# Patient Record
Sex: Female | Born: 1995 | Race: White | Hispanic: No | Marital: Married | Smoking: Never smoker
Health system: Southern US, Community
[De-identification: ages and names within clinical notes are randomized; demographics above are authoritative.]

## PROBLEM LIST (undated history)

## (undated) DIAGNOSIS — O24419 Gestational diabetes mellitus in pregnancy, unspecified control: Secondary | ICD-10-CM

## (undated) DIAGNOSIS — G43909 Migraine, unspecified, not intractable, without status migrainosus: Secondary | ICD-10-CM

## (undated) HISTORY — DX: Gestational diabetes mellitus in pregnancy, unspecified control: O24.419

## (undated) HISTORY — PX: ADENOIDECTOMY: SUR15

## (undated) HISTORY — PX: TYMPANOSTOMY TUBE PLACEMENT: SHX32

## (undated) HISTORY — DX: Migraine, unspecified, not intractable, without status migrainosus: G43.909

---
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
## 2017-11-28 ENCOUNTER — Other Ambulatory Visit: Payer: Self-pay

## 2017-11-28 ENCOUNTER — Telehealth: Payer: Self-pay

## 2017-11-28 DIAGNOSIS — Z30011 Encounter for initial prescription of contraceptive pills: Secondary | ICD-10-CM

## 2017-11-28 MED ORDER — SRONYX 0.1-20 MG-MCG PO TABS: 1.0000 | ORAL_TABLET | Freq: Every day | ORAL | 0 refills | Status: DC

## 2017-11-28 MED ORDER — SRONYX 0.1-20 MG-MCG PO TABS: 1.0000 | ORAL_TABLET | Freq: Every day | ORAL | 11 refills | Status: DC

## 2017-11-28 NOTE — Telephone Encounter (Signed)
Monique Reyes calling for [employer]. [facility] OCP. She is out & has an apt 12/05/17. ZO#[phone]#[phone]

## 2017-11-28 NOTE — Telephone Encounter (Signed)
Refill sent.

## 2017-12-05 ENCOUNTER — Encounter: Payer: Self-pay | Admitting: Obstetrics and Gynecology

## 2017-12-05 ENCOUNTER — Ambulatory Visit (INDEPENDENT_AMBULATORY_CARE_PROVIDER_SITE_OTHER): Payer: 59 | Admitting: Obstetrics and Gynecology

## 2017-12-05 VITALS — BP 120/80 | HR 57 | Ht 68.0 in | Wt 191.0 lb

## 2017-12-05 DIAGNOSIS — Z124 Encounter for screening for malignant neoplasm of cervix: Secondary | ICD-10-CM

## 2017-12-05 DIAGNOSIS — Z113 Encounter for screening for infections with a predominantly sexual mode of transmission: Secondary | ICD-10-CM

## 2017-12-05 DIAGNOSIS — Z01419 Encounter for gynecological examination (general) (routine) without abnormal findings: Secondary | ICD-10-CM | POA: Diagnosis not present

## 2017-12-05 DIAGNOSIS — Z3041 Encounter for surveillance of contraceptive pills: Secondary | ICD-10-CM

## 2017-12-05 MED ORDER — LEVONORGESTREL-ETHINYL ESTRAD 0.1-20 MG-MCG PO TABS: 1.0000 | ORAL_TABLET | Freq: Every day | ORAL | 3 refills | Status: DC

## 2017-12-05 NOTE — Patient Instructions (Signed)
I value your feedback and entrusting us with your care. If you get a [facility] patient survey, I would appreciate you taking the time to let us know about your experience today. Thank you! 

## 2017-12-05 NOTE — Progress Notes (Signed)
PCP:  Patient, No Pcp Per   Chief Complaint  Patient presents with  . Gynecologic Exam     HPI:      Ms. Merwyn KatosKylie N Underwood is a 21 y.o. G0P0000 who LMP was Patient's last menstrual period was 11/28/2017., presents today for her annual examination.  Her menses are regular every 28-30 days, lasting 5 days.  Dysmenorrhea none. She does not have intermenstrual bleeding.  Sex activity: single partner, contraception - OCP (estrogen/progesterone).  Hx of STDs: none  There is no FH of breast cancer. There is a FH of ovarian cancer in her MGGM, genetic testing not indicated for pt. Pt's MGM is "gene neg"; awaiting MyRisk results for pt's mom. The patient does not do self-breast exams.  Tobacco use: The patient denies current or previous tobacco use. Alcohol use: social drinker No drug use.  Exercise: not active  She does get adequate calcium and Vitamin D in her diet.  Gardasil vaccine declined in the past and today.   Past Medical History:  Diagnosis Date  . Migraine     Past Surgical History:  Procedure Laterality Date  . ADENOIDECTOMY    . TYMPANOSTOMY TUBE PLACEMENT      Family History  Problem Relation Age of Onset  . Ovarian cancer Other   . Colon cancer Other     Social History   Socioeconomic History  . Marital status: Unknown    Spouse name: Not on file  . Number of children: Not on file  . Years of education: Not on file  . Highest education level: Not on file  Social Needs  . Financial resource strain: Not on file  . Food insecurity - worry: Not on file  . Food insecurity - inability: Not on file  . Transportation needs - medical: Not on file  . Transportation needs - non-medical: Not on file  Occupational History  . Not on file  Tobacco Use  . Smoking status: Never Smoker  . Smokeless tobacco: Never Used  Substance and Sexual Activity  . Alcohol use: No    Frequency: Never  . Drug use: No  . Sexual activity: Yes    Birth control/protection:  Pill  Other Topics Concern  . Not on file  Social History Narrative  . Not on file    Current Meds  Medication Sig  . levonorgestrel-ethinyl estradiol (SRONYX) 0.1-20 MG-MCG tablet Take 1 tablet by mouth daily.  . [DISCONTINUED] SRONYX 0.1-20 MG-MCG tablet Take 1 tablet by mouth daily.     ROS:  Review of Systems  Constitutional: Negative for fatigue, fever and unexpected weight change.  Respiratory: Negative for cough, shortness of breath and wheezing.   Cardiovascular: Negative for chest pain, palpitations and leg swelling.  Gastrointestinal: Negative for blood in stool, constipation, diarrhea, nausea and vomiting.  Endocrine: Negative for cold intolerance, heat intolerance and polyuria.  Genitourinary: Negative for dyspareunia, dysuria, flank pain, frequency, genital sores, hematuria, menstrual problem, pelvic pain, urgency, vaginal bleeding, vaginal discharge and vaginal pain.  Musculoskeletal: Negative for back pain, joint swelling and myalgias.  Skin: Negative for rash.  Neurological: Negative for dizziness, syncope, light-headedness, numbness and headaches.  Hematological: Negative for adenopathy.  Psychiatric/Behavioral: Negative for agitation, confusion, sleep disturbance and suicidal ideas. The patient is not nervous/anxious.      Objective: BP 120/80   Pulse (!) 57   Ht 5\' 8"  (1.727 m)   Wt 191 lb (86.6 kg)   LMP 11/28/2017   BMI 29.04 kg/m  Physical Exam  Constitutional: She is oriented to person, place, and time. She appears well-developed and well-nourished.  Genitourinary: Vagina normal and uterus normal. There is no rash or tenderness on the right labia. There is no rash or tenderness on the left labia. No erythema or tenderness in the vagina. No vaginal discharge found. Right adnexum does not display mass and does not display tenderness. Left adnexum does not display mass and does not display tenderness. Cervix does not exhibit motion tenderness or polyp.  Uterus is not enlarged or tender.  Neck: Normal range of motion. No thyromegaly present.  Cardiovascular: Normal rate, regular rhythm and normal heart sounds.  No murmur heard. Pulmonary/Chest: Effort normal and breath sounds normal. Right breast exhibits no mass, no nipple discharge, no skin change and no tenderness. Left breast exhibits no mass, no nipple discharge, no skin change and no tenderness.  Abdominal: Soft. There is no tenderness. There is no guarding.  Musculoskeletal: Normal range of motion.  Neurological: She is alert and oriented to person, place, and time. No cranial nerve deficit.  Psychiatric: She has a normal mood and affect. Her behavior is normal.  Vitals reviewed.  Assessment/Plan: Encounter for annual routine gynecological examination  Cervical cancer screening - Plan: IGP,CtNgTv,rfx Aptima HPV ASCU  Screening for STD (sexually transmitted disease) - Plan: IGP,CtNgTv,rfx Aptima HPV ASCU  Encounter for surveillance of contraceptive pills - OCP RF - Plan: levonorgestrel-ethinyl estradiol (SRONYX) 0.1-20 MG-MCG tablet  Meds ordered this encounter  Medications  . levonorgestrel-ethinyl estradiol (SRONYX) 0.1-20 MG-MCG tablet    Sig: Take 1 tablet by mouth daily.    Dispense:  3 Package    Refill:  3             GYN counsel adequate intake of calcium and vitamin D, Gardasil vaccine     F/U  Return in about 1 year (around 12/05/2018).  Lorriann Hansmann B. Breelyn Icard, PA-C 12/05/2017 3:26 PM

## 2017-12-07 LAB — IGP,CTNGTV,RFX APTIMA HPV ASCU
Chlamydia, Nuc. Acid Amp: NEGATIVE
Gonococcus, Nuc. Acid Amp: NEGATIVE
PAP Smear Comment: 0
Trich vag by NAA: NEGATIVE

## 2018-01-21 ENCOUNTER — Ambulatory Visit
Admission: EM | Admit: 2018-01-21 | Discharge: 2018-01-21 | Disposition: A | Discharge status: Home / Self Care | Payer: 59 | Attending: Emergency Medicine | Admitting: Emergency Medicine

## 2018-01-21 ENCOUNTER — Other Ambulatory Visit: Payer: Self-pay

## 2018-01-21 DIAGNOSIS — K529 Noninfective gastroenteritis and colitis, unspecified: Secondary | ICD-10-CM | POA: Diagnosis not present

## 2018-01-21 MED ORDER — ONDANSETRON 8 MG PO TBDP
8.0000 mg | ORAL_TABLET | Freq: Once | ORAL | Status: AC
  Administered 2018-01-21: 8 mg via ORAL

## 2018-01-21 MED ORDER — ONDANSETRON 8 MG PO TBDP: 8.0000 mg | ORAL_TABLET | Freq: Three times a day (TID) | ORAL | 0 refills | Status: DC | PRN

## 2018-01-21 NOTE — ED Provider Notes (Signed)
HPI  SUBJECTIVE:  Monique Reyes is a 22 y.o. female who presents with multiple episodes of nonbilious, nonbloody emesis starting 2 days ago. accompanied by watery, nonbloody diarrhea.  The vomiting stopped yesterday, then she ate a grilled cheese and that she vomited again.  States that she is tolerating saltine crackers today.  She reports no further episodes of diarrhea.  She states that she is thirsty.  She tried ibuprofen 400 mg with improvement in her symptoms 2 days ago.  No antipyretic in the past 6-8 hours.  Symptoms are worse with eating food.  She denies fevers, abdominal pain, abdominal distention, change in urine output.  States her manager had identical symptoms prior to her illness starting.  She denies any raw or undercooked foods, questionable leftovers.  No exposure to petting zoos, chickens, reptiles.  Past medical history negative for kidney disease, diabetes, hypertension.  LMP: Now.  Denies the possibility of being pregnant.  PMD: None.   Past Medical History:  Diagnosis Date  . Migraine     Past Surgical History:  Procedure Laterality Date  . ADENOIDECTOMY    . TYMPANOSTOMY TUBE PLACEMENT      Family History  Problem Relation Age of Onset  . Ovarian cancer Other   . Colon cancer Other     Social History   Tobacco Use  . Smoking status: Never Smoker  . Smokeless tobacco: Never Used  Substance Use Topics  . Alcohol use: Yes    Frequency: Never    Comment: social  . Drug use: No    No current facility-administered medications for this encounter.   Current Outpatient Medications:  .  levonorgestrel-ethinyl estradiol (SRONYX) 0.1-20 MG-MCG tablet, Take 1 tablet by mouth daily., Disp: 3 Package, Rfl: 3 .  ondansetron (ZOFRAN ODT) 8 MG disintegrating tablet, Take 1 tablet (8 mg total) by mouth every 8 (eight) hours as needed for nausea or vomiting., Disp: 20 tablet, Rfl: 0  No Known Allergies   ROS  As noted in HPI.   Physical Exam  BP 126/66 (BP  Location: Left Arm)   Pulse (!) 53   Temp 98 F (36.7 C)   Resp 18   Ht 5\' 7"  (1.702 m)   Wt 190 lb (86.2 kg)   LMP 01/18/2018   SpO2 98%   BMI 29.76 kg/m  Orthostatic VS for the past 24 hrs:  BP- Lying Pulse- Lying BP- Sitting Pulse- Sitting BP- Standing at 0 minutes Pulse- Standing at 0 minutes  01/21/18 1017 119/61 59 116/73 62 122/70 74    Constitutional: Well developed, well nourished, no acute distress Eyes:  EOMI, conjunctiva normal bilaterally HENT: Normocephalic, atraumatic,mucus membranes moist Respiratory: Normal inspiratory effort Cardiovascular: Normal rate and rhythm, no murmurs, rubs, gallops. GI: nondistended soft, nontender, active bowel sounds, no rebound, guarding.  Negative Murphy, negative McBurney  Back no CVA tenderness  skin: Refill less than 2 seconds, no rash, skin intact Musculoskeletal: no deformities Neurologic: Alert & oriented x 3, no focal neuro deficits Psychiatric: Speech and behavior appropriate   ED Course   Medications  ondansetron (ZOFRAN-ODT) disintegrating tablet 8 mg (8 mg Oral Given 01/21/18 1017)    Orders Placed This Encounter  Procedures  . Orthostatic vital signs    Standing Status:   Standing    Number of Occurrences:   1  . Offer Fluids    Standing Status:   Standing    Number of Occurrences:   20    No results found for this  or any previous visit (from the past 24 hour(s)). No results found.  ED Clinical Impression  Gastroenteritis   ED Assessment/Plan  Will check orthostatics, but patient does appear well-hydrated.  She states that she is starting to tolerate p.o. today.  We will give Zofran 8 mg and do p.o. challenge.  Deferring labs.  Doubt intra-abdominal process.  Her abdomen is completely benign.  Presentation is most consistent with a viral gastroenteritis.  Home with Zofran 3 times daily as needed, bland diet, advance diet as tolerated. Will provide a primary care referral list for routine  care.  Patient slightly orthostatic.  She was tolerating p.o. prior to discharge.  Will have her rehydrate orally.  Do not think that she needs IV fluids today.  Discussed  MDM, plan and followup with patient. Discussed sn/sx that should prompt return to the ED. patient agrees with plan.   Meds ordered this encounter  Medications  . ondansetron (ZOFRAN-ODT) disintegrating tablet 8 mg  . ondansetron (ZOFRAN ODT) 8 MG disintegrating tablet    Sig: Take 1 tablet (8 mg total) by mouth every 8 (eight) hours as needed for nausea or vomiting.    Dispense:  20 tablet    Refill:  0    *This clinic note was created using Scientist, clinical (histocompatibility and immunogenetics). Therefore, there may be occasional mistakes despite careful proofreading.   ?   Domenick Gong, MD 01/21/18 [phone]

## 2018-01-21 NOTE — Discharge Instructions (Signed)
Here is a list of primary care providers who are taking new patients: ° °Dr. Deanna Jones, Dr. William Plonk °[address] °[address] °[place] [address] °[phone] ° °[facility] [place] °[address] [address]  °[place] [address]  °[phone] ° °[facility] °[address]  °[place], [place] [address] °[phone] ° °[facility] [place] °[address]  °[phone] °[place], [place] [address] ° °Here are clinics/ other resources who will see you if you do not have insurance. Some have certain criteria that you must meet. Call them and find out what they are: ° °[facility]: °[address] [place] [address] [place], [place] [address] °Phone: [phone] °Hours: First and Third Saturdays of each Month, 9 a.m. - 1 p.m. ° °[facility]: °[address], [place], [place] [address] °Phone: [phone] °Hours: °Tuesday, 4 p.m. - 8 p.m. °Thursday, 1 p.m. - 8 p.m. °Wednesday, 9 a.m. - Noon ° °[facility] °[address], [place], [place] [address] °Phone: [phone] °Pharmacy Phone Number: [phone] °Dental Phone Number: [phone] °ACA Insurance Help: [phone] ° °Dental Hours: °Monday - Thursday, 8 a.m. - 6 p.m. ° °[facility] °[address] [place], [place] [address] °Phone: [phone] °Pharmacy Phone Number: [phone] °ACA Insurance Help: [phone] ° °[facility] °[address] [place], [place] [address] °Phone: [phone] °Pharmacy Phone Number: [phone] °ACA Insurance Help: [phone] ° °[facility] °[address] [place], [place] [address] °Phone: [phone] °ACA Insurance Help: [phone]  ° °[facility] °[address] [place],  [address] °Phone: [phone] ° °Go to www.goodrx.com to look up your medications. This will give you a list of where you can find your prescriptions at the most affordable prices. Or ask the pharmacist what the cash price is,  or if they have any other discount programs available to help make your medication more affordable. This can be less expensive than what you would pay with insurance.   °

## 2018-01-21 NOTE — ED Triage Notes (Signed)
Pt reports her manager at work was sick and couldn't keep anything down. Pt worked with her and then started with n/v/d since Friday.

## 2018-01-24 ENCOUNTER — Telehealth: Payer: Self-pay

## 2018-01-24 NOTE — Telephone Encounter (Signed)
I called pt for visit follow up. Monique Reyes is doing better and will f/u as needed. Monique Reyes reports getting the wrong Dr. Phoebe SharpsNote and is coming back in to get another Dr. Note for her visit. I instructed I will print and leave with front desk staff.

## 2018-11-16 ENCOUNTER — Other Ambulatory Visit: Payer: Self-pay

## 2018-11-16 ENCOUNTER — Other Ambulatory Visit: Payer: Self-pay | Admitting: Obstetrics and Gynecology

## 2018-11-16 DIAGNOSIS — Z30011 Encounter for initial prescription of contraceptive pills: Secondary | ICD-10-CM

## 2018-11-16 DIAGNOSIS — Z3041 Encounter for surveillance of contraceptive pills: Secondary | ICD-10-CM

## 2018-11-16 MED ORDER — LEVONORGESTREL-ETHINYL ESTRAD 0.1-20 MG-MCG PO TABS: 1.0000 | ORAL_TABLET | Freq: Every day | ORAL | 3 refills | Status: DC

## 2018-11-16 NOTE — Telephone Encounter (Signed)
Refilled pt OCP to last to annual

## 2018-12-20 ENCOUNTER — Encounter: Payer: Self-pay | Admitting: Obstetrics and Gynecology

## 2018-12-20 ENCOUNTER — Other Ambulatory Visit (HOSPITAL_COMMUNITY)
Admission: RE | Admit: 2018-12-20 | Discharge: 2018-12-20 | Disposition: A | Discharge status: Home / Self Care | Payer: Managed Care, Other (non HMO) | Source: Ambulatory Visit | Attending: Obstetrics and Gynecology | Admitting: Obstetrics and Gynecology

## 2018-12-20 ENCOUNTER — Ambulatory Visit: Payer: Self-pay | Admitting: Obstetrics and Gynecology

## 2018-12-20 ENCOUNTER — Ambulatory Visit (INDEPENDENT_AMBULATORY_CARE_PROVIDER_SITE_OTHER): Payer: Managed Care, Other (non HMO) | Admitting: Obstetrics and Gynecology

## 2018-12-20 VITALS — BP 118/60 | HR 71 | Ht 66.0 in | Wt 198.0 lb

## 2018-12-20 DIAGNOSIS — Z124 Encounter for screening for malignant neoplasm of cervix: Secondary | ICD-10-CM | POA: Diagnosis present

## 2018-12-20 DIAGNOSIS — Z113 Encounter for screening for infections with a predominantly sexual mode of transmission: Secondary | ICD-10-CM | POA: Diagnosis present

## 2018-12-20 DIAGNOSIS — Z3041 Encounter for surveillance of contraceptive pills: Secondary | ICD-10-CM

## 2018-12-20 DIAGNOSIS — Z01419 Encounter for gynecological examination (general) (routine) without abnormal findings: Secondary | ICD-10-CM

## 2018-12-20 MED ORDER — LEVONORGESTREL-ETHINYL ESTRAD 0.1-20 MG-MCG PO TABS: 1.0000 | ORAL_TABLET | Freq: Every day | ORAL | 3 refills | Status: DC

## 2018-12-20 NOTE — Progress Notes (Signed)
PCP:  Patient, No Pcp Per   Chief Complaint  Patient presents with  . Gynecologic Exam     HPI:      [name] is a 22 y.o. G0P0000 who LMP was Patient's last menstrual period was 11/22/2018 (approximate)., presents today for her annual examination.  Her menses are regular every 28-30 days, lasting 4 days.  Dysmenorrhea none. She does not have intermenstrual bleeding.  Sex activity: single partner, contraception - OCP (estrogen/progesterone).  Last pap: 12/05/17 Results: no abnormalities Hx of STDs: none  There is no FH of breast cancer. There is a FH of ovarian cancer in her MGGM, genetic testing not indicated for pt. Pt's MGM is "gene neg"; mom is MyRisk neg. The patient does not do self-breast exams.  Tobacco use: The patient denies current or previous tobacco use. Alcohol use: social drinker No drug use.  Exercise: not active  She does get adequate calcium and Vitamin D in her diet.  Gardasil vaccine declined in the past and today.   Past Medical History:  Diagnosis Date  . Migraine     Past Surgical History:  Procedure Laterality Date  . ADENOIDECTOMY    . TYMPANOSTOMY TUBE PLACEMENT      Family History  Problem Relation Age of Onset  . Ovarian cancer Other   . Colon cancer Other     Social History   Socioeconomic History  . Marital status: Single    Spouse name: Not on file  . Number of children: Not on file  . Years of education: Not on file  . Highest education level: Not on file  Occupational History  . Not on file  Social Needs  . Financial resource strain: Not on file  . Food insecurity:    Worry: Not on file    Inability: Not on file  . Transportation needs:    Medical: Not on file    Non-medical: Not on file  Tobacco Use  . Smoking status: Never Smoker  . Smokeless tobacco: Never Used  Substance and Sexual Activity  . Alcohol use: Yes    Frequency: Never    Comment: social  . Drug use: No  . Sexual activity: Yes   Birth control/protection: Pill  Lifestyle  . Physical activity:    Days per week: Not on file    Minutes per session: Not on file  . Stress: Not on file  Relationships  . Social connections:    Talks on phone: Not on file    Gets together: Not on file    Attends religious service: Not on file    Active member of club or organization: Not on file    Attends meetings of clubs or organizations: Not on file    Relationship status: Not on file  . Intimate partner violence:    Fear of current or ex partner: Not on file    Emotionally abused: Not on file    Physically abused: Not on file    Forced sexual activity: Not on file  Other Topics Concern  . Not on file  Social History Narrative  . Not on file    Current Meds  Medication Sig  . levonorgestrel-ethinyl estradiol (SRONYX) 0.1-20 MG-MCG tablet Take 1 tablet by mouth daily.  . [DISCONTINUED] levonorgestrel-ethinyl estradiol (SRONYX) 0.1-20 MG-MCG tablet Take 1 tablet by mouth daily.     ROS:  Review of Systems  Constitutional: Negative for fatigue, fever and unexpected weight change.  Respiratory: Negative for cough,  shortness of breath and wheezing.   Cardiovascular: Negative for chest pain, palpitations and leg swelling.  Gastrointestinal: Negative for blood in stool, constipation, diarrhea, nausea and vomiting.  Endocrine: Negative for cold intolerance, heat intolerance and polyuria.  Genitourinary: Negative for dyspareunia, dysuria, flank pain, frequency, genital sores, hematuria, menstrual problem, pelvic pain, urgency, vaginal bleeding, vaginal discharge and vaginal pain.  Musculoskeletal: Negative for back pain, joint swelling and myalgias.  Skin: Negative for rash.  Neurological: Negative for dizziness, syncope, light-headedness, numbness and headaches.  Hematological: Negative for adenopathy.  Psychiatric/Behavioral: Negative for agitation, confusion, sleep disturbance and suicidal ideas. The patient is not  nervous/anxious.      Objective: BP 118/60   Pulse 71   Ht 5\' 6"  (1.676 m)   Wt 198 lb (89.8 kg)   LMP 11/22/2018 (Approximate)   BMI 31.96 kg/m    Physical Exam Constitutional:      Appearance: She is well-developed.  Genitourinary:     Vulva, vagina, uterus, right adnexa and left adnexa normal.     No vulval lesion or tenderness noted.     No vaginal discharge, erythema or tenderness.     No cervical motion tenderness or polyp.     Uterus is not enlarged or tender.     No right or left adnexal mass present.     Right adnexa not tender.     Left adnexa not tender.  Neck:     Musculoskeletal: Normal range of motion.     Thyroid: No thyromegaly.  Cardiovascular:     Rate and Rhythm: Normal rate and regular rhythm.     Heart sounds: Normal heart sounds. No murmur.  Pulmonary:     Effort: Pulmonary effort is normal.     Breath sounds: Normal breath sounds.  Chest:     Breasts:        Right: No mass, nipple discharge, skin change or tenderness.        Left: No mass, nipple discharge, skin change or tenderness.  Abdominal:     Palpations: Abdomen is soft.     Tenderness: There is no abdominal tenderness. There is no guarding.  Musculoskeletal: Normal range of motion.  Neurological:     Mental Status: She is alert and oriented to person, place, and time.     Cranial Nerves: No cranial nerve deficit.  Psychiatric:        Behavior: Behavior normal.  Vitals signs reviewed.    Assessment/Plan: Encounter for annual routine gynecological examination  Cervical cancer screening - Plan: Cytology - PAP  Screening for STD (sexually transmitted disease) - Plan: Cytology - PAP  Encounter for surveillance of contraceptive pills - OCP RF.  - Plan: levonorgestrel-ethinyl estradiol (SRONYX) 0.1-20 MG-MCG tablet  Meds ordered this encounter  Medications  . levonorgestrel-ethinyl estradiol (SRONYX) 0.1-20 MG-MCG tablet    Sig: Take 1 tablet by mouth daily.    Dispense:  3  Package    Refill:  3    Order Specific Question:   Supervising Provider    Answer:   Nadara MustardHARRIS, ROBERT P [[id number]]             GYN counsel adequate intake of calcium and vitamin D, Gardasil vaccine     F/U  Return in about 1 year (around 12/21/2019).  Val Schiavo B. Kimberlynn Lumbra, PA-C 12/20/2018 9:55 AM

## 2018-12-20 NOTE — Patient Instructions (Signed)
I value your feedback and entrusting us with your care. If you get a [facility] patient survey, I would appreciate you taking the time to let us know about your experience today. Thank you! 

## 2018-12-25 LAB — CYTOLOGY - PAP
ADEQUACY: ABSENT
CHLAMYDIA, DNA PROBE: NEGATIVE
DIAGNOSIS: NEGATIVE
Neisseria Gonorrhea: NEGATIVE

## 2019-02-12 ENCOUNTER — Other Ambulatory Visit: Payer: Self-pay

## 2019-02-12 ENCOUNTER — Ambulatory Visit
Admission: EM | Admit: 2019-02-12 | Discharge: 2019-02-12 | Disposition: A | Discharge status: Home / Self Care | Payer: 59 | Attending: Family Medicine | Admitting: Family Medicine

## 2019-02-12 ENCOUNTER — Encounter: Payer: Self-pay | Admitting: Emergency Medicine

## 2019-02-12 DIAGNOSIS — B9789 Other viral agents as the cause of diseases classified elsewhere: Secondary | ICD-10-CM

## 2019-02-12 DIAGNOSIS — J988 Other specified respiratory disorders: Secondary | ICD-10-CM

## 2019-02-12 MED ORDER — BENZONATATE 100 MG PO CAPS: 100.0000 mg | ORAL_CAPSULE | Freq: Three times a day (TID) | ORAL | 0 refills | Status: DC | PRN

## 2019-02-12 MED ORDER — BUTALBITAL-APAP-CAFFEINE 50-325-40 MG PO TABS: 1.0000 | ORAL_TABLET | Freq: Four times a day (QID) | ORAL | 0 refills | Status: DC | PRN

## 2019-02-12 NOTE — ED Provider Notes (Signed)
[facility]    CSN: [id number] Arrival date & time: 02/12/19  1847  History   Chief Complaint Chief Complaint  Patient presents with  . Cough  . Headache   HPI  23 year old female presents with the above complaints.  2-day history of cough, headache, and sweats.  No documented fever.  She reports that she has had dizziness as well.  No medications or interventions tried.  No known exacerbating factors.  No other associated symptoms.  No other complaints.  PMH, Surgical Hx, Family Hx, Social History reviewed and updated as below.  Past Medical History:  Diagnosis Date  . Migraine    Past Surgical History:  Procedure Laterality Date  . ADENOIDECTOMY    . TYMPANOSTOMY TUBE PLACEMENT     OB History    Gravida  0   Para  0   Term  0   Preterm  0   AB  0   Living  0     SAB  0   TAB  0   Ectopic  0   Multiple  0   Live Births  0          Home Medications    Prior to Admission medications   Medication Sig Start Date End Date Taking? Authorizing Provider  levonorgestrel-ethinyl estradiol (SRONYX) 0.1-20 MG-MCG tablet Take 1 tablet by mouth daily. 12/20/18  Yes Copland, Ilona Sorrel, PA-C  benzonatate (TESSALON) 100 MG capsule Take 1 capsule (100 mg total) by mouth 3 (three) times daily as needed for cough. 02/12/19   Tommie Sams, DO  butalbital-acetaminophen-caffeine (FIORICET, ESGIC) [phone] MG tablet Take 1 tablet by mouth every 6 (six) hours as needed for headache or migraine. 02/12/19 02/12/20  Tommie Sams, DO    Family History Family History  Problem Relation Age of Onset  . Ovarian cancer Other   . Colon cancer Other   . Healthy Mother   . Other Father        unknown medical history   Social History Social History   Tobacco Use  . Smoking status: Never Smoker  . Smokeless tobacco: Never Used  Substance Use Topics  . Alcohol use: Yes    Frequency: Never    Comment: social  . Drug use: No   Allergies   Patient has no  known allergies.   Review of Systems Review of Systems  Constitutional: Negative for fever.  Respiratory: Positive for cough.   Neurological: Positive for dizziness and headaches.   Physical Exam Triage Vital Signs ED Triage Vitals  Enc Vitals Group     BP 02/12/19 1926 135/74     Pulse Rate 02/12/19 1926 71     Resp 02/12/19 1926 16     Temp 02/12/19 1926 97.9 F (36.6 C)     Temp Source 02/12/19 1926 Oral     SpO2 02/12/19 1926 100 %     Weight 02/12/19 1925 194 lb (88 kg)     Height 02/12/19 1925 5\' 6"  (1.676 m)     Head Circumference --      Peak Flow --      Pain Score 02/12/19 1925 0     Pain Loc --      Pain Edu? --      Excl. in GC? --    Updated Vital Signs BP 135/74 (BP Location: Left Arm)   Pulse 71   Temp 97.9 F (36.6 C) (Oral)   Resp 16   Ht 5\' 6"  (  1.676 m)   Wt 88 kg   LMP 01/15/2019 (Approximate)   SpO2 100%   BMI 31.31 kg/m   Visual Acuity Right Eye Distance:   Left Eye Distance:   Bilateral Distance:    Right Eye Near:   Left Eye Near:    Bilateral Near:     Physical Exam Vitals signs and nursing note reviewed.  Constitutional:      General: She is not in acute distress.    Appearance: Normal appearance.  HENT:     Head: Normocephalic and atraumatic.     Right Ear: Tympanic membrane normal.     Left Ear: Tympanic membrane normal.     Mouth/Throat:     Pharynx: Oropharynx is clear.  Eyes:     General:        Right eye: No discharge.        Left eye: No discharge.     Conjunctiva/sclera: Conjunctivae normal.  Cardiovascular:     Rate and Rhythm: Normal rate and regular rhythm.  Pulmonary:     Effort: Pulmonary effort is normal.     Breath sounds: Normal breath sounds.  Neurological:     Mental Status: She is alert.  Psychiatric:        Mood and Affect: Mood normal.        Behavior: Behavior normal.    UC Treatments / Results  Labs (all labs ordered are listed, but only abnormal results are displayed) Labs Reviewed - No  data to display  EKG None  Radiology No results found.  Procedures Procedures (including critical care time)  Medications Ordered in UC Medications - No data to display  Initial Impression / Assessment and Plan / UC Course  I have reviewed the triage vital signs and the nursing notes.  Pertinent labs & imaging results that were available during my care of the patient were reviewed by me and considered in my medical decision making (see chart for details).    23 year old female presents with viral respiratory infection.  Exam unrevealing.  No evidence of flu.  Fioricet for headache.  Tessalon Perles for cough.  Supportive care.  Final Clinical Impressions(s) / UC Diagnoses   Final diagnoses:  Viral respiratory infection     Discharge Instructions     Rest.  Medication as prescribed.  Take care  Dr. Adriana Simas     ED Prescriptions    Medication Sig Dispense Auth. Provider   butalbital-acetaminophen-caffeine (FIORICET, ESGIC) [phone] MG tablet Take 1 tablet by mouth every 6 (six) hours as needed for headache or migraine. 20 tablet Janis Sol G, DO   benzonatate (TESSALON) 100 MG capsule Take 1 capsule (100 mg total) by mouth 3 (three) times daily as needed for cough. 30 capsule Tommie Sams, DO     Controlled Substance Prescriptions [place] Controlled Substance Registry consulted? Not Applicable   Tommie Sams, DO 02/12/19 2048

## 2019-02-12 NOTE — ED Triage Notes (Signed)
Patient in today c/o cough, headache, sweats x 2 days. Patient denies fever. Patient has not tried any OTC medications.

## 2019-02-12 NOTE — Discharge Instructions (Signed)
Rest  Medication as prescribed.  Take care  Dr. Ulah Olmo  

## 2019-03-25 ENCOUNTER — Ambulatory Visit
Admission: EM | Admit: 2019-03-25 | Discharge: 2019-03-25 | Disposition: A | Discharge status: Home / Self Care | Payer: 59 | Attending: Urgent Care | Admitting: Urgent Care

## 2019-03-25 ENCOUNTER — Other Ambulatory Visit: Payer: Self-pay

## 2019-03-25 ENCOUNTER — Ambulatory Visit (INDEPENDENT_AMBULATORY_CARE_PROVIDER_SITE_OTHER): Payer: 59

## 2019-03-25 DIAGNOSIS — J069 Acute upper respiratory infection, unspecified: Secondary | ICD-10-CM

## 2019-03-25 DIAGNOSIS — R0981 Nasal congestion: Secondary | ICD-10-CM

## 2019-03-25 DIAGNOSIS — R05 Cough: Secondary | ICD-10-CM

## 2019-03-25 DIAGNOSIS — R0602 Shortness of breath: Secondary | ICD-10-CM

## 2019-03-25 DIAGNOSIS — R059 Cough, unspecified: Secondary | ICD-10-CM

## 2019-03-25 MED ORDER — BENZONATATE 100 MG PO CAPS: 100.0000 mg | ORAL_CAPSULE | Freq: Three times a day (TID) | ORAL | 0 refills | Status: DC | PRN

## 2019-03-25 MED ORDER — AMOXICILLIN 875 MG PO TABS: 875.0000 mg | ORAL_TABLET | Freq: Two times a day (BID) | ORAL | 0 refills | Status: DC

## 2019-03-25 NOTE — ED Provider Notes (Signed)
[facility] [address], [address] [place], [place] [address] [phone]   Time seen: Approximately 4:13 PM  I have reviewed the triage vital signs and the nursing notes.   HISTORY  Chief Complaint Nasal Congestion   HPI Monique Reyes is a 23 y.o. female presents today with a  1 month history of worsening cough and congestion. Cough is intermittently productive (green/yellow sputum). She notes (+) post-tussive vomiting. Patient states, "when I get to coughing really hard, I will throw up the lining of my stomach and any food that may be in there". She denies fevers. Patient has been taking cetirizine and mucinex for symptomatic relief, however notes that neither intervention has improved her symptoms. Patient with exertional shortness of breath. She denies any associated pleuritic chest pain or episodes of hemoptysis. Additionally, patient with complaints of a cough induced headache that has been refractory to the use of APAP.    Past Medical History:  Diagnosis Date  . Migraine     There are no active problems to display for this patient.   Past Surgical History:  Procedure Laterality Date  . ADENOIDECTOMY    . TYMPANOSTOMY TUBE PLACEMENT       No current facility-administered medications for this encounter.   Current Outpatient Medications:  .  levonorgestrel-ethinyl estradiol (SRONYX) 0.1-20 MG-MCG tablet, Take 1 tablet by mouth daily., Disp: 3 Package, Rfl: 3 .  benzonatate (TESSALON) 100 MG capsule, Take 1 capsule (100 mg total) by mouth 3 (three) times daily as needed for cough., Disp: 30 capsule, Rfl: 0 .  butalbital-acetaminophen-caffeine (FIORICET, ESGIC) 50-325-40 MG tablet, Take 1 tablet by mouth every 6 (six) hours as needed for headache or migraine., Disp: 20 tablet, Rfl: 0  Allergies Patient has no known allergies.  Family history Family History  Problem Relation Age of Onset  . Ovarian cancer Other   .  Colon cancer Other   . Healthy Mother   . Other Father        unknown medical history    Social history Social History   Tobacco Use  . Smoking status: Never Smoker  . Smokeless tobacco: Never Used  Substance Use Topics  . Alcohol use: Yes    Frequency: Never    Comment: social  . Drug use: No    Review of Systems Review of Systems  Constitutional: Positive for malaise/fatigue. Negative for diaphoresis, fever and weight loss.  HENT: Negative.   Eyes: Negative.   Respiratory: Positive for cough, sputum production and shortness of breath. Negative for hemoptysis.   Cardiovascular: Negative for chest pain, palpitations, orthopnea, leg swelling and PND.  Gastrointestinal: Positive for nausea and vomiting (post-tussive). Negative for abdominal pain, blood in stool, constipation, diarrhea and melena.  Musculoskeletal: Positive for myalgias. Negative for back pain, falls and joint pain.  Skin: Negative for itching and rash.  Neurological: Positive for headaches (cough induced). Negative for dizziness, tremors and weakness.  Endo/Heme/Allergies: Does not bruise/bleed easily.  Psychiatric/Behavioral: Negative.   All other systems reviewed and are negative.  ____________________________________________   PHYSICAL EXAM:  VITAL SIGNS: ED Triage Vitals  Enc Vitals Group     BP 03/25/19 1549 (!) 120/59     Pulse Rate 03/25/19 1548 (!) 59     Resp 03/25/19 1548 18     Temp 03/25/19 1548 98 F (36.7 C)     Temp Source 03/25/19 1548 Oral     SpO2 03/25/19 1548 99 %  Weight 03/25/19 1550 180 lb (81.6 kg)     Height 03/25/19 1550 5\' 7"  (1.702 m)     Head Circumference --      Peak Flow --      Pain Score 03/25/19 1550 0     Pain Loc --      Pain Edu? --      Excl. in GC? --     Physical Exam  Constitutional: She is oriented to person, place, and time and well-developed, well-nourished, and in no distress.  HENT:  Head: Normocephalic and atraumatic.  Right Ear: Hearing  and tympanic membrane normal.  Left Ear: Hearing and tympanic membrane normal.  Nose: Mucosal edema, rhinorrhea and sinus tenderness present.  Mouth/Throat: Mucous membranes are normal. Posterior oropharyngeal erythema present.  Eyes: Pupils are equal, round, and reactive to light. EOM are normal. No scleral icterus.  Neck: Normal range of motion. Neck supple. No tracheal deviation present. No thyromegaly present.  Cardiovascular: Normal rate, regular rhythm, normal heart sounds and intact distal pulses. Exam reveals no gallop and no friction rub.  No murmur heard. Pulmonary/Chest: Effort normal and breath sounds normal. No respiratory distress. She has no wheezes. She has no rales.  Abdominal: Soft. Bowel sounds are normal. She exhibits no distension. There is no abdominal tenderness.  Musculoskeletal: Normal range of motion.        General: No tenderness or edema.  Lymphadenopathy:    She has no cervical adenopathy.    She has no axillary adenopathy.       Right: No inguinal and no supraclavicular adenopathy present.       Left: No inguinal and no supraclavicular adenopathy present.  Neurological: She is alert and oriented to person, place, and time.  Skin: Skin is warm and dry. No rash noted. No erythema.  Psychiatric: Mood, affect and judgment normal.  Nursing note and vitals reviewed.  ___________________________________________  RADIOLOGY  Dg Chest 2 View  Result Date: 03/25/2019 CLINICAL DATA:  Productive cough and chest pain for 1 month EXAM: CHEST - 2 VIEW COMPARISON:  None. FINDINGS: The heart size and mediastinal contours are within normal limits. Both lungs are clear. The visualized skeletal structures are unremarkable. IMPRESSION: No active cardiopulmonary disease. Electronically Signed   By: Alcide Clever M.D.   On: 03/25/2019 16:33   ____________________________________________   INITIAL IMPRESSION / ASSESSMENT AND PLAN / URGENT CARE COURSE  Pertinent labs & imaging  results that were available during my care of the patient were reviewed by me and considered in my medical decision making (see chart for details).   Monique Reyes is a 23 y.o. female who presents today with 1 month month history of worsening cough and congestion. Cough is productive in nature (green/yellow). She denies fevers. Patient with associated shortness of breath and sinus tenderness. Patient notes that she normally has issues with seasonal allergies, however they have never been this bad. Patient has treated symptomatically with mucinex and cetirizine. She has slight paranasal sinus tenderness on exam. CXR reviewed as negative for acute infection.   Given the chronicity of her symptom constellation, will treat with anti-tussives and cover with course of amoxicillin. Patient encouraged to continue mucinex and ensure that she remains adequately hydrated. Patient also educated on the use of a cool mist vaporizer to help with symptomatic relief.   Discussed follow up with primary care physician this week for re-evaluation. Discussed follow up and return precautions for any new or worsening symptoms. Patient verbalized understanding and consent  with the discharge plan as reviewed. All questions were answered prior to patient discharge.    ____________________________________________  FINAL CLINICAL IMPRESSION(S) / URGENT CARE DIAGNOSES  Final diagnoses:  Cough  SOB (shortness of breath)    ED Discharge Orders         Ordered    benzonatate (TESSALON) 100 MG capsule  3 times daily PRN     03/25/19 1641    amoxicillin (AMOXIL) 875 MG tablet  2 times daily     03/25/19 1641          Note: This dictation was prepared with Dragon dictation along with smaller phrase technology. Any transcriptional errors that result from this process are unintentional.        Verlee Monte, NP 03/25/19 1643

## 2019-03-25 NOTE — Discharge Instructions (Signed)
You are being prescribed antibiotics for an upper respiratory tract infection. Your chest xray was fine.   Continue to take the mucinex. Stay HYDRATED Take the antibiotics and cough medicine as prescribed.

## 2019-03-25 NOTE — ED Triage Notes (Signed)
Pt here for cough, nasal congestion for 1 month and thinks she has a sinus infection. Did cough so hard today she did throw up. Did start taking otc zyrtec and mucinex.

## 2020-02-11 ENCOUNTER — Other Ambulatory Visit: Payer: Self-pay

## 2020-02-11 ENCOUNTER — Ambulatory Visit (INDEPENDENT_AMBULATORY_CARE_PROVIDER_SITE_OTHER): Payer: Managed Care, Other (non HMO)

## 2020-02-11 ENCOUNTER — Encounter: Payer: Self-pay | Admitting: Emergency Medicine

## 2020-02-11 ENCOUNTER — Ambulatory Visit
Admission: EM | Admit: 2020-02-11 | Discharge: 2020-02-11 | Disposition: A | Discharge status: Home / Self Care | Payer: Managed Care, Other (non HMO) | Attending: Family Medicine | Admitting: Family Medicine

## 2020-02-11 DIAGNOSIS — Z20822 Contact with and (suspected) exposure to covid-19: Secondary | ICD-10-CM | POA: Diagnosis present

## 2020-02-11 DIAGNOSIS — J069 Acute upper respiratory infection, unspecified: Secondary | ICD-10-CM

## 2020-02-11 DIAGNOSIS — R05 Cough: Secondary | ICD-10-CM | POA: Diagnosis not present

## 2020-02-11 DIAGNOSIS — Z3202 Encounter for pregnancy test, result negative: Secondary | ICD-10-CM | POA: Insufficient documentation

## 2020-02-11 DIAGNOSIS — Z7189 Other specified counseling: Secondary | ICD-10-CM | POA: Insufficient documentation

## 2020-02-11 LAB — PREGNANCY, URINE: Preg Test, Ur: NEGATIVE

## 2020-02-11 NOTE — ED Provider Notes (Addendum)
[place], [place]   Name: Monique Reyes DOB: 1996-12-19 MRN: [id number] CSN: [id number] PCP: Patient, No Pcp Per  Arrival date and time:  02/11/20 0915  Chief Complaint:  Cough and Emesis   NOTE: Prior to seeing the patient today, I have reviewed the triage nursing documentation and vital signs. Clinical staff has updated patient's PMH/PSHx, current medication list, and drug allergies/intolerances to ensure comprehensive history available to assist in medical decision making.   History:   HPI: Monique Reyes is a 24 y.o. female who presents today with complaints of fatigue, cough, congestion, sore throat (due to cough), and generalized cough that started approximately 2 days ago. Patient denies fevers. Cough is productive of green and brown sputum with no associated shortness of breath or wheezing. She has experienced any nausea, vomiting, diarrhea. No abdominal pain. Vomiting has been mainly post-tussive.  She is eating and drinking well. Patient denies any perceived alterations to her sense of taste or smell. Patient denies being in close contact with anyone known to have SARS-CoV-2 (novel coronavirus). He husband had pneumonia x 2 weeks ago. She has never been tested for SARS-CoV-2 in the past per her report. Patient has not been vaccinated for influenza this season. In efforts to conservatively manage her symptoms at home, the patient notes that she has used Delsym and cold/flu medication, which has not helped to improve her symptoms.  Additionally, patient is questioning possible pregnancy. Patient's last menstrual period was 01/11/2020 (approximate).  Past Medical History:  Diagnosis Date  . Migraine     Past Surgical History:  Procedure Laterality Date  . ADENOIDECTOMY    . TYMPANOSTOMY TUBE PLACEMENT      Family History  Problem Relation Age of Onset  . Ovarian cancer Other   . Colon cancer Other   . Healthy Mother   . Other Father        unknown medical history     Social History   Tobacco Use  . Smoking status: Never Smoker  . Smokeless tobacco: Never Used  Substance Use Topics  . Alcohol use: Yes    Comment: social  . Drug use: No    There are no problems to display for this patient.   Home Medications:    No outpatient medications have been marked as taking for the 02/11/20 encounter [facility] Encounter).    Allergies:   Patient has no known allergies.  Review of Systems (ROS): Review of Systems  Constitutional: Positive for fatigue and fever.  HENT: Positive for sore throat (2/2 cough). Negative for congestion, ear pain, postnasal drip, rhinorrhea, sinus pressure, sinus pain and sneezing.   Eyes: Negative for pain, discharge and redness.  Respiratory: Positive for cough. Negative for chest tightness and shortness of breath.   Cardiovascular: Negative for chest pain and palpitations.  Gastrointestinal: Positive for diarrhea, nausea and vomiting. Negative for abdominal pain.  Musculoskeletal: Negative for arthralgias, back pain, myalgias and neck pain.  Skin: Negative for color change, pallor and rash.  Neurological: Positive for weakness (generalized). Negative for dizziness, syncope and headaches.  Hematological: Negative for adenopathy.     Vital Signs: Today's Vitals   02/11/20 0935 02/11/20 0936 02/11/20 1040  BP: 124/67    Pulse: 82    Resp: 18    Temp: 98.3 F (36.8 C)    TempSrc: Oral    SpO2: 99%    Weight:  220 lb (99.8 kg)   Height:  5\' 7"  (1.702 m)   PainSc:  0-No pain  0-No pain    Physical Exam: Physical Exam  Constitutional: She is oriented to person, place, and time and well-developed, well-nourished, and in no distress.  HENT:  Head: Normocephalic and atraumatic.  Right Ear: Tympanic membrane normal.  Left Ear: Tympanic membrane normal.  Nose: Rhinorrhea present. No mucosal edema or sinus tenderness.  Mouth/Throat: Uvula is midline and mucous membranes are normal. Posterior oropharyngeal erythema  (mild; (+) clear PND) present.  Eyes: Pupils are equal, round, and reactive to light.  Cardiovascular: Normal rate, regular rhythm, normal heart sounds and intact distal pulses.  Pulmonary/Chest: Effort normal. She has decreased breath sounds in the right lower field.  Moderate cough noted in clinic. No SOB or increased WOB. No distress. Able to speak in complete sentences without difficulties. SPO2 99% on RA.  Musculoskeletal:     Cervical back: Normal range of motion and neck supple.  Neurological: She is alert and oriented to person, place, and time. Gait normal.  Skin: Skin is warm and dry. No rash noted. She is not diaphoretic.  Psychiatric: Mood, memory, affect and judgment normal.  Nursing note and vitals reviewed.   Urgent Care Treatments / Results:   Orders Placed This Encounter  Procedures  . Novel Coronavirus, NAA (Hosp order, Send-out to Ref Lab; TAT 18-24 hrs  . DG Chest 2 View  . Pregnancy, urine    LABS: PLEASE NOTE: all labs that were ordered this encounter are listed, however only abnormal results are displayed. Labs Reviewed  NOVEL CORONAVIRUS, NAA (HOSP ORDER, SEND-OUT TO REF LAB; TAT 18-24 HRS)  PREGNANCY, URINE    EKG: -None  RADIOLOGY: DG Chest 2 View  Result Date: 02/11/2020 CLINICAL DATA:  Cough and shortness of breath EXAM: CHEST - 2 VIEW COMPARISON:  March 25, 2019. FINDINGS: Lungs are clear. Heart size and pulmonary vascularity are normal. No adenopathy. No bone lesions. IMPRESSION: No abnormality noted. Electronically Signed   By: Bretta Bang III M.D.   On: 02/11/2020 10:31    PROCEDURES: Procedures  MEDICATIONS RECEIVED THIS VISIT: Medications - No data to display  PERTINENT CLINICAL COURSE NOTES/UPDATES:   Initial Impression / Assessment and Plan / Urgent Care Course:  Pertinent labs & imaging results that were available during my care of the patient were personally reviewed by me and considered in my medical decision making (see  lab/imaging section of note for values and interpretations).  Monique Reyes is a 24 y.o. female who presents to [facility] today with complaints of Cough and Emesis  Patient overall well appearing and in no acute distress today in clinic. Presenting symptoms (see HPI) and exam as documented above. She presents with symptoms associated with SARS-CoV-2 (novel coronavirus). Discussed typical symptom constellation. Reviewed potential for infection and need for testing. Patient amenable to being tested. SARS-CoV-2 swab collected by certified clinical staff. Discussed variable turn around times associated with testing, as swabs are being processed at [facility], and have been taking between 24-48 hours to come back. She was advised to self quarantine, per [facility] DHHS guidelines, until negative results received. These measures are being implemented out of an abundance of caution to prevent transmission and spread during the current SARS-CoV-2 pandemic.   Urine HCG negative for pregnancy.   Radiographs of the chest revealed no acute cardiopulmonary process; no evidence of peribronchial thickening, areas of consolidation, or focal infiltrates.  Presenting symptoms consistent with acute viral illness with cough. Until ruled out with confirmatory lab testing, SARS-CoV-2 remains part of the differential. Her testing  is pending at this time. I discussed with her that her symptoms are felt to be viral in nature, thus antibiotics would not offer her any relief or improve her symptoms any faster than conservative symptomatic management.  Intervention for cough offered, however patient declined citing that her symptoms are mild/controlled. Patient declined offered antiemetic medication as well today. Discussed supportive care measures at home during acute phase of illness. Patient to rest as much as possible. She was encouraged to ensure adequate hydration (water and ORS) to prevent dehydration and electrolyte  derangements.  Recommended warm salt water gargles, hard candies/lozenges, and hot tea with honey/lemon to help soothe the throat and reduce irritation. May use Tylenol and/or Ibuprofen as needed for pain/fever.    Current clinical condition warrants patient being out of work in order to quarantine while waiting for testing results. She was provided with the appropriate documentation to provide to her place of employment that will allow for her to RTW on 02/13/2020 with no restrictions. RTW is contingent on her SARS-CoV-2 test results being reviewed as negative.     Discussed follow up with primary care physician in 1 week for re-evaluation. I have reviewed the follow up and strict return precautions for any new or worsening symptoms. Patient is aware of symptoms that would be deemed urgent/emergent, and would thus require further evaluation either here or in the emergency department. At the time of discharge, she verbalized understanding and consent with the discharge plan as it was reviewed with her. All questions were fielded by provider and/or clinic staff prior to patient discharge.    Final Clinical Impressions / Urgent Care Diagnoses:   Final diagnoses:  Viral URI with cough  Encounter for laboratory testing for COVID-19 virus  Encounter for pregnancy test with result negative  Advice given about COVID-19 virus infection    New Prescriptions:  [place] Controlled Substance Registry consulted? Not Applicable  No orders of the defined types were placed in this encounter.   Recommended Follow up Care:  Patient encouraged to follow up with the following provider within the specified time frame, or sooner as dictated by the severity of her symptoms. As always, she was instructed that for any urgent/emergent care needs, she should seek care either here or in the emergency department for more immediate evaluation.  Follow-up Information    PCP In 1 week.   Why: General reassessment of symptoms if  not improving        NOTE: This note was prepared using Scientist, clinical (histocompatibility and immunogenetics) along with smaller Lobbyist. Despite my best ability to proofread, there is the potential that transcriptional errors may still occur from this process, and are completely unintentional.    Verlee Monte, NP 02/11/20 1125

## 2020-02-11 NOTE — Discharge Instructions (Addendum)
It was very nice seeing you today in clinic. Thank you for entrusting me with your care.  ° °Rest and stay HYDRATED. Water and electrolyte containing beverages (Gatorade, Pedialyte) are best to prevent dehydration and electrolyte abnormalities.  °Recommend warm salt water gargles, hard candies/lozenges, and hot tea with honey/lemon to help soothe the throat and reduce irritation.  °May use Tylenol and/or Ibuprofen as needed for pain/fever.   ° °You were tested for SARS-CoV-2 (novel coronavirus) today. Testing is performed by an outside lab ([facility]) and has variable turn around times ranging between 2-5 days. Current recommendations from the the CDC and [place] DHHS require that you remain out of work in order to quarantine at home until negative test results are have been received. In the event that your test results are positive, you will be contacted with further directives. These measures are being implemented out of an abundance of caution to prevent transmission and spread during the current SARS-CoV-2 pandemic. ° °Make arrangements to follow up with your regular doctor in 1 week for re-evaluation if not improving. If your symptoms/condition worsens, please seek follow up care either here or in the ER. Please remember, our [facility] providers are "right here with you" when you need us.  ° °Again, it was my pleasure to take care of you today. Thank you for choosing our clinic. I hope that you start to feel better quickly.  ° °Nieves Barberi, MSN, APRN, FNP-C, CEN °Advanced Practice Provider °[facility] °

## 2020-02-11 NOTE — ED Triage Notes (Signed)
Pt c/o cough, diarrhea, vomiting, nasal congestion, runny nose. Started about 2 days ago.

## 2020-02-12 ENCOUNTER — Telehealth: Payer: Self-pay | Admitting: Urgent Care

## 2020-02-12 LAB — NOVEL CORONAVIRUS, NAA (HOSP ORDER, SEND-OUT TO REF LAB; TAT 18-24 HRS): SARS-CoV-2, NAA: NOT DETECTED

## 2020-02-12 MED ORDER — AMOXICILLIN-POT CLAVULANATE 875-125 MG PO TABS: 1.0000 | ORAL_TABLET | Freq: Two times a day (BID) | ORAL | 0 refills | Status: AC

## 2020-02-12 MED ORDER — HYDROCOD POLST-CPM POLST ER 10-8 MG/5ML PO SUER: 5.0000 mL | Freq: Two times a day (BID) | ORAL | 0 refills | Status: DC | PRN

## 2020-02-12 NOTE — Telephone Encounter (Signed)
Patient called requesting COVID results. Advised patient her COVID results were negative. She states she is still coughing and at times will vomit brown/green mucous. Patient requesting an antibiotic and cough medication. Message sent to Quentin Mulling, NP.

## 2020-02-12 NOTE — Telephone Encounter (Signed)
    Date: 02/12/2020  Name: Monique Reyes DOB: 1996-12-01 MRN: [id number]  Re: Clinical update and medication request  Patient seen in clinic on 02/11/2020 with URI symptoms, which she was conservatively managing at home. SARS-CoV-2 (novel coronavirus) was suspected. Since her visit, her molecular PCR SARS-CoV-2 testing has resulted as negative. Patient returning call today with reports of acute symptom exacerbation. Cough has progressed and she continues to experience episodes of post-tussive emesis. Cough is productive of green sputum. She was offered intervention for cough yesterday, however she declined. In light of her worsening symptoms, will proceed with treatment.   Allergies: NKDA noted in the patient's EMR.   [place] Controlled Substance Registry consulted? Yes - no concerns identified.  Meds ordered this encounter  Medications  . amoxicillin-clavulanate (AUGMENTIN) 875-125 MG tablet    Sig: Take 1 tablet by mouth 2 (two) times daily for 7 days.    Dispense:  14 tablet    Refill:  0  . chlorpheniramine-HYDROcodone (TUSSIONEX PENNKINETIC ER) 10-8 MG/5ML SUER    Sig: Take 5 mLs by mouth every 12 (twelve) hours as needed for cough. Will causes drowsiness; NO DRIVING.    Dispense:  70 mL    Refill:  0   Disposition: Medications sent to patient's pharmacy on record. Will have clinic nursing staff reach out to patient to make her aware of new orders, including education on use and side effects associated with the cough medication (somnolence, take as directed, no driving). Patient should rest and stay hydrated, with water and electrolyte containing beverages (Gatorade, Pedialyte) being best, to prevent dehydration and electrolyte abnormalities. Patient to follow up with PCP in 1 week if not improving.   Quentin Mulling, MSN, APRN, FNP-C, CEN Advanced Practice Provider [facility] 02/12/2020 4:30 PM

## 2020-04-30 ENCOUNTER — Other Ambulatory Visit: Payer: Self-pay

## 2020-04-30 ENCOUNTER — Ambulatory Visit
Admission: EM | Admit: 2020-04-30 | Discharge: 2020-04-30 | Disposition: A | Discharge status: Home / Self Care | Payer: Managed Care, Other (non HMO) | Attending: Family Medicine | Admitting: Family Medicine

## 2020-04-30 ENCOUNTER — Encounter: Payer: Self-pay | Admitting: Emergency Medicine

## 2020-04-30 DIAGNOSIS — Z20822 Contact with and (suspected) exposure to covid-19: Secondary | ICD-10-CM | POA: Insufficient documentation

## 2020-04-30 DIAGNOSIS — B349 Viral infection, unspecified: Secondary | ICD-10-CM | POA: Insufficient documentation

## 2020-04-30 MED ORDER — ONDANSETRON 4 MG PO TBDP: 4.0000 mg | ORAL_TABLET | Freq: Three times a day (TID) | ORAL | 0 refills | Status: DC | PRN

## 2020-04-30 NOTE — ED Triage Notes (Signed)
Pt c/o sweats, muscle pain, fever (100.8), chills, cough, headache, runny nose and nasal congestion. Started last night.

## 2020-04-30 NOTE — ED Provider Notes (Signed)
[facility] ____________________________________________  Time seen: Approximately 7:14 PM  I have reviewed the triage vital signs and the nursing notes.   HISTORY  Chief Complaint No chief complaint on file.   HPI Monique Reyes is a 24 y.o. female presenting for evaluation of runny nose, nasal congestion, cough, chills, body aches and fever since last night.  Some nausea.  Denies vomiting or diarrhea.  Denies changes in taste or smell.  States T-max 100.8 last night.  Has not taken Tylenol or ibuprofen.  Has continued to overall eat and drink well but decreased appetite.  Denies known sick contacts.  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  Denies chest pain, shortness of breath, rash.  Denies recent sickness.  Reports otherwise doing well.    Past Medical History:  Diagnosis Date  . Migraine     There are no problems to display for this patient.   Past Surgical History:  Procedure Laterality Date  . ADENOIDECTOMY    . TYMPANOSTOMY TUBE PLACEMENT       No current facility-administered medications for this encounter.  Current Outpatient Medications:  .  chlorpheniramine-HYDROcodone (TUSSIONEX PENNKINETIC ER) 10-8 MG/5ML SUER, Take 5 mLs by mouth every 12 (twelve) hours as needed for cough. Will causes drowsiness; NO DRIVING., Disp: 70 mL, Rfl: 0 .  ondansetron (ZOFRAN ODT) 4 MG disintegrating tablet, Take 1 tablet (4 mg total) by mouth every 8 (eight) hours as needed for nausea or vomiting., Disp: 15 tablet, Rfl: 0  Allergies Patient has no known allergies.  Family History  Problem Relation Age of Onset  . Ovarian cancer Other   . Colon cancer Other   . Healthy Mother   . Other Father        unknown medical history    Social History Social History   Tobacco Use  . Smoking status: Never Smoker  . Smokeless tobacco: Never Used  Substance Use Topics  . Alcohol use: Yes    Comment: social  . Drug use: No    Review of Systems  Constitutional: Positive fever ENT: No sore throat.  Positive congestion. Cardiovascular: Denies chest pain. Respiratory: Denies shortness of breath. Gastrointestinal: No abdominal pain.  Positive nausea, no vomiting.  No diarrhea.  Genitourinary: Negative for dysuria. Musculoskeletal: Negative for back pain. Skin: Negative for rash.   ____________________________________________   PHYSICAL EXAM:  VITAL SIGNS: ED Triage Vitals  Enc Vitals Group     BP 04/30/20 1833 111/76     Pulse Rate 04/30/20 1833 87     Resp 04/30/20 1833 18     Temp 04/30/20 1833 98.9 F (37.2 C)     Temp Source 04/30/20 1833 Oral     SpO2 04/30/20 1833 99 %     Weight 04/30/20 1830 220 lb 0.3 oz (99.8 kg)     Height 04/30/20 1830 5\' 7"  (1.702 m)     Head Circumference --      Peak Flow --      Pain Score 04/30/20 1829 7     Pain Loc --      Pain Edu? --      Excl. in GC? --     Constitutional: Alert and oriented. Well appearing and in no acute distress. Eyes: Conjunctivae are normal.  Head: Atraumatic. No sinus tenderness to palpation. No swelling. No erythema.  Ears: no erythema, normal TMs bilaterally.   Nose:Nasal congestion   Mouth/Throat: Mucous membranes are moist. No pharyngeal erythema. No tonsillar swelling or  exudate.  Neck: No stridor.  No cervical spine tenderness to palpation. Hematological/Lymphatic/Immunilogical: No cervical lymphadenopathy. Cardiovascular: Normal rate, regular rhythm. Grossly normal heart sounds.  Good peripheral circulation. Respiratory: Normal respiratory effort.  No retractions. No wheezes, rales or rhonchi. Good air movement.  Musculoskeletal: Ambulatory with steady gait.  Neurologic:  Normal speech and language. No gait instability. Skin:  Skin appears warm, dry and intact. No rash noted. Psychiatric: Mood and affect are normal. Speech and behavior are normal.  ___________________________________________   LABS (all labs ordered are listed, but only  abnormal results are displayed)  Labs Reviewed  SARS CORONAVIRUS 2 (TAT 6-24 HRS)     PROCEDURES Procedures   INITIAL IMPRESSION / ASSESSMENT AND PLAN / ED COURSE  Pertinent labs & imaging results that were available during my care of the patient were reviewed by me and considered in my medical decision making (see chart for details).  Well-appearing patient.  No acute distress.  Suspect viral illness, possible COVID-19.  COVID-19 testing completed.  As needed Zofran Rx given.  Encourage rest, fluids, supportive care.  Work note given. Discussed indication, risks and benefits of medications with patient.  Discussed follow up and return parameters including no resolution or any worsening concerns. Patient verbalized understanding and agreed to plan.   ____________________________________________   FINAL CLINICAL IMPRESSION(S) / ED DIAGNOSES  Final diagnoses:  Viral illness  Encounter for laboratory testing for COVID-19 virus     ED Discharge Orders         Ordered    ondansetron (ZOFRAN ODT) 4 MG disintegrating tablet  Every 8 hours PRN     04/30/20 1850           Note: This dictation was prepared with Dragon dictation along with smaller phrase technology. Any transcriptional errors that result from this process are unintentional.         Marylene Land, NP 04/30/20 1918

## 2020-04-30 NOTE — Discharge Instructions (Signed)
Take medication as prescribed, as needed. Rest. Drink plenty of fluids. Tylenol and ibuprofen as needed.   Follow up with your primary care physician this week as needed. Return to Urgent care for new or worsening concerns.

## 2020-05-01 LAB — SARS CORONAVIRUS 2 (TAT 6-24 HRS): SARS Coronavirus 2: NEGATIVE

## 2020-06-27 ENCOUNTER — Other Ambulatory Visit: Payer: Self-pay

## 2020-06-27 ENCOUNTER — Ambulatory Visit
Admission: EM | Admit: 2020-06-27 | Discharge: 2020-06-27 | Disposition: A | Discharge status: Home / Self Care | Payer: Managed Care, Other (non HMO) | Attending: Emergency Medicine | Admitting: Emergency Medicine

## 2020-06-27 DIAGNOSIS — Z20822 Contact with and (suspected) exposure to covid-19: Secondary | ICD-10-CM | POA: Insufficient documentation

## 2020-06-27 DIAGNOSIS — E860 Dehydration: Secondary | ICD-10-CM | POA: Diagnosis not present

## 2020-06-27 DIAGNOSIS — K2970 Gastritis, unspecified, without bleeding: Secondary | ICD-10-CM

## 2020-06-27 LAB — URINALYSIS, COMPLETE (UACMP) WITH MICROSCOPIC
Glucose, UA: NEGATIVE mg/dL
Ketones, ur: 40 mg/dL — AB
Leukocytes,Ua: NEGATIVE
Nitrite: NEGATIVE
Protein, ur: 100 mg/dL — AB
Specific Gravity, Urine: >1.03 — ABNORMAL HIGH (ref 1.005–1.030)
pH: 5.5 (ref 5.0–8.0)

## 2020-06-27 LAB — BASIC METABOLIC PANEL
Anion gap: 13 (ref 5–15)
BUN: 18 mg/dL (ref 6–20)
CO2: 25 mmol/L (ref 22–32)
Calcium: 9.1 mg/dL (ref 8.9–10.3)
Chloride: 99 mmol/L (ref 98–111)
Creatinine, Ser: 1.05 mg/dL — ABNORMAL HIGH (ref 0.44–1.00)
GFR calc Af Amer: >60 mL/min (ref >60)
GFR calc non Af Amer: >60 mL/min (ref >60)
Glucose, Bld: 110 mg/dL — ABNORMAL HIGH (ref 70–99)
Potassium: 3.8 mmol/L (ref 3.5–5.1)
Sodium: 137 mmol/L (ref 135–145)

## 2020-06-27 LAB — HCG, QUANTITATIVE, PREGNANCY: hCG, Beta Chain, Quant, S: <1 m[IU]/mL (ref <5)

## 2020-06-27 LAB — SARS CORONAVIRUS 2 AG (30 MIN TAT): SARS Coronavirus 2 Ag: NEGATIVE

## 2020-06-27 MED ORDER — ONDANSETRON 4 MG PO TBDP: 4.0000 mg | ORAL_TABLET | Freq: Three times a day (TID) | ORAL | 0 refills | Status: DC | PRN

## 2020-06-27 NOTE — ED Provider Notes (Signed)
[facility] [place]   Name: Monique Reyes DOB: May 29, 1996 MRN: [id number] CSN: [id number] PCP: Patient, No Pcp Per  Arrival date and time:  06/27/20 0920  Chief Complaint:  Emesis and Diarrhea   NOTE: Prior to seeing the patient today, I have reviewed the triage nursing documentation and vital signs. Clinical staff has updated patient's PMH/PSHx, current medication list, and drug allergies/intolerances to ensure comprehensive history available to assist in medical decision making.   History:   HPI: Monique Reyes is a 24 y.o. female who presents today with complaints of nausea vomiting diarrhea for 2 days.  Patient states she has not been able to keep anything down for the past 2 days.  She denies any out of the ordinary foods or drinks.  She states she was in [facility] last weekend, but returned and had no symptoms until about Wednesday night.  She notes that she also has some abdominal pain and generalized body aches.  T-max at home peaked at 100.2.  She has not taken anything for her nausea, vomiting, or slightly elevated temperature.   Patient denies any known exposure to COVID-19.  She states she does not wear her mask in public, only when she works.  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  She states she has unprotected sex with one partner and she does not have any form of birth control.    Past Medical History:  Diagnosis Date  . Migraine     Past Surgical History:  Procedure Laterality Date  . ADENOIDECTOMY    . TYMPANOSTOMY TUBE PLACEMENT      Family History  Problem Relation Age of Onset  . Ovarian cancer Other   . Colon cancer Other   . Healthy Mother   . Other Father        unknown medical history    Social History   Tobacco Use  . Smoking status: Never Smoker  . Smokeless tobacco: Never Used  Vaping Use  . Vaping Use: Never used  Substance Use Topics  . Alcohol use: Yes    Comment: social  . Drug  use: No    There are no problems to display for this patient.   Home Medications:    No outpatient medications have been marked as taking for the 06/27/20 encounter [facility] Encounter).    Allergies:   Patient has no known allergies.  Review of Systems (ROS): Review of Systems  Constitutional: Positive for appetite change, chills, fatigue and fever.  HENT: Negative for facial swelling, sinus pressure, trouble swallowing and voice change.   Respiratory: Negative for cough, shortness of breath, wheezing and stridor.   Gastrointestinal: Positive for abdominal pain, diarrhea, nausea and vomiting. Negative for abdominal distention.  Genitourinary: Negative for decreased urine volume, difficulty urinating, flank pain, frequency and urgency.  Musculoskeletal: Positive for arthralgias. Negative for gait problem and joint swelling.  Neurological: Positive for weakness. Negative for dizziness, light-headedness and headaches.     Vital Signs: Today's Vitals   06/27/20 0937 06/27/20 0938 06/27/20 1125  BP: 122/88    Pulse: (!) 107    Resp: 20    Temp: 99.2 F (37.3 C)    TempSrc: Oral    Weight:  210 lb (95.3 kg)   Height:  5\' 7"  (1.702 m)   PainSc:  7  5     Physical Exam: Physical Exam Vitals and nursing note reviewed.  Constitutional:      General:  She is not in acute distress.    Appearance: Normal appearance. She is well-developed.  HENT:     Head: Normocephalic and atraumatic.     Right Ear: A middle ear effusion is present.     Left Ear: A middle ear effusion is present.     Ears:     Comments: Clear middle ear effusion Eyes:     Conjunctiva/sclera: Conjunctivae normal.  Cardiovascular:     Rate and Rhythm: Normal rate and regular rhythm.     Pulses: Normal pulses.     Heart sounds: Normal heart sounds. No murmur heard.   Pulmonary:     Effort: Pulmonary effort is normal. No respiratory distress.     Breath sounds: Normal breath sounds.  Abdominal:      Palpations: Abdomen is soft.     Tenderness: There is abdominal tenderness in the left upper quadrant and left lower quadrant.  Musculoskeletal:     Cervical back: Neck supple.  Skin:    General: Skin is warm and dry.  Neurological:     Mental Status: She is alert.  Psychiatric:        Mood and Affect: Mood normal.        Behavior: Behavior normal.        Thought Content: Thought content normal.      Urgent Care Treatments / Results:   LABS: PLEASE NOTE: all labs that were ordered this encounter are listed, however only abnormal results are displayed. Labs Reviewed  URINALYSIS, COMPLETE (UACMP) WITH MICROSCOPIC - Abnormal; Notable for the following components:      Result Value   Specific Gravity, Urine >1.030 (*)    Hgb urine dipstick MODERATE (*)    Bilirubin Urine MODERATE (*)    Ketones, ur 40 (*)    Protein, ur 100 (*)    Bacteria, UA FEW (*)    All other components within normal limits  BASIC METABOLIC PANEL - Abnormal; Notable for the following components:   Glucose, Bld 110 (*)    Creatinine, Ser 1.05 (*)    All other components within normal limits  SARS CORONAVIRUS 2 AG (30 MIN TAT)  HCG, QUANTITATIVE, PREGNANCY    EKG: -None  RADIOLOGY: No results found.  PROCEDURES: Procedures  MEDICATIONS RECEIVED THIS VISIT: Medications - No data to display  PERTINENT CLINICAL COURSE NOTES/UPDATES:   Initial Impression / Assessment and Plan / Urgent Care Course:  Pertinent labs & imaging results that were available during my care of the patient were personally reviewed by me and considered in my medical decision making (see lab/imaging section of note for values and interpretations).  Monique Reyes is a 24 y.o. female who presents to [facility] today with complaints of nausea vomiting and diarrhea, diagnosed with dehydration, and treated as such with the medications below. NP and patient reviewed discharge instructions below during visit.   Patient is  well appearing overall in clinic today. She does not appear to be in any acute distress. Presenting symptoms (see HPI) and exam as documented above.   I have reviewed the follow up and strict return precautions for any new or worsening symptoms. Patient is aware of symptoms that would be deemed urgent/emergent, and would thus require further evaluation either here or in the emergency department. At the time of discharge, she verbalized understanding and consent with the discharge plan as it was reviewed with her. All questions were fielded by provider and/or clinic staff prior to patient discharge.  Final Clinical Impressions / Urgent Care Diagnoses:   Final diagnoses:  Dehydration  Gastritis without bleeding, unspecified chronicity, unspecified gastritis type    New Prescriptions:  [place] Controlled Substance Registry consulted? Not Applicable  Meds ordered this encounter  Medications  . ondansetron (ZOFRAN ODT) 4 MG disintegrating tablet    Sig: Take 1 tablet (4 mg total) by mouth every 8 (eight) hours as needed for nausea or vomiting.    Dispense:  15 tablet    Refill:  0      Discharge Instructions     You were seen for nausea vomiting and diarrhea and are being treated for dehydration.   Monitor your symptoms and temperature.  A fever is anything above 101.2 Fahrenheit.  If you have a fever treated with ibuprofen and Tylenol.  Stay hydrated with water and Pedialyte.  Focus on preventative care.  Get a primary care provider for routine evaluation and to rule out diabetes.  Think about using contraception or barrier protection when having sex.  Wear a mask and consider COVID-19 vaccination to prevent a serious COVID-19 infection. ----- Take care, Dr. Sharlet Salina, NP-c     Recommended Follow up Care:  Patient encouraged to follow up with the following provider within the specified time frame, or sooner as dictated by the severity of her symptoms. As always, she was instructed  that for any urgent/emergent care needs, she should seek care either here or in the emergency department for more immediate evaluation.   Bailey Mech, DNP, NP-c    Bailey Mech, NP 06/27/20 1616

## 2020-06-27 NOTE — ED Triage Notes (Signed)
Pt reports having N/V/D x2 days. Reports not being able to keep anything down. Reports having "15 episodes of emesis and a lot of diarrhea".

## 2020-06-27 NOTE — Discharge Instructions (Addendum)
You were seen for nausea vomiting and diarrhea and are being treated for dehydration.   Monitor your symptoms and temperature.  A fever is anything above 101.2 Fahrenheit.  If you have a fever treated with ibuprofen and Tylenol.  Stay hydrated with water and Pedialyte.  Focus on preventative care.  Get a primary care provider for routine evaluation and to rule out diabetes.  Think about using contraception or barrier protection when having sex.  Wear a mask and consider COVID-19 vaccination to prevent a serious COVID-19 infection. ----- Take care, Dr. Sharlet Salina, NP-c

## 2020-12-26 NOTE — L&D Delivery Note (Signed)
Vaginal Delivery Note  Spontaneous delivery of live viable female infant from the ROA position through an intact perineum. Delivery of anterior left shoulder with gentle downward guidance followed by delivery of the right posterior shoulder with gentle upward guidance. Body followed spontaneously. Infant placed on maternal chest. Nursery present and helped with neonatal resuscitation and evaluation. Cord clamped and cut after one minute. Cord blood not collected. Placenta delivered spontaneously and intact with a 3 vessel cord.  2nd degree perineal and right labial laceration. Uterus firm and below umbilicus at the end of the delivery.  Mom and baby recovering in stable condition. Sponge and needle counts were correct at the end of the delivery. Methergine and cytotec were given for bleeding.   APGARS: 1 minute:9 5 minutes: 9 Weight: pending Epidural present 2nd degree perineal and right labial laceration  Adelene Idler MD [facility], [facility] 08/12/21 7:38 PM

## 2021-01-19 ENCOUNTER — Other Ambulatory Visit: Payer: Self-pay

## 2021-01-19 ENCOUNTER — Encounter: Payer: Self-pay | Admitting: Obstetrics and Gynecology

## 2021-01-19 ENCOUNTER — Ambulatory Visit (INDEPENDENT_AMBULATORY_CARE_PROVIDER_SITE_OTHER): Payer: Managed Care, Other (non HMO) | Admitting: Obstetrics and Gynecology

## 2021-01-19 VITALS — BP 100/64 | HR 72 | Wt 214.0 lb

## 2021-01-19 DIAGNOSIS — Z113 Encounter for screening for infections with a predominantly sexual mode of transmission: Secondary | ICD-10-CM

## 2021-01-19 DIAGNOSIS — Z131 Encounter for screening for diabetes mellitus: Secondary | ICD-10-CM

## 2021-01-19 DIAGNOSIS — N912 Amenorrhea, unspecified: Secondary | ICD-10-CM | POA: Diagnosis not present

## 2021-01-19 DIAGNOSIS — Z3402 Encounter for supervision of normal first pregnancy, second trimester: Secondary | ICD-10-CM | POA: Insufficient documentation

## 2021-01-19 DIAGNOSIS — Z0283 Encounter for blood-alcohol and blood-drug test: Secondary | ICD-10-CM

## 2021-01-19 DIAGNOSIS — Z124 Encounter for screening for malignant neoplasm of cervix: Secondary | ICD-10-CM

## 2021-01-19 DIAGNOSIS — O0990 Supervision of high risk pregnancy, unspecified, unspecified trimester: Secondary | ICD-10-CM

## 2021-01-19 DIAGNOSIS — Z369 Encounter for antenatal screening, unspecified: Secondary | ICD-10-CM

## 2021-01-19 DIAGNOSIS — O99210 Obesity complicating pregnancy, unspecified trimester: Secondary | ICD-10-CM | POA: Insufficient documentation

## 2021-01-19 DIAGNOSIS — Z3A01 Less than 8 weeks gestation of pregnancy: Secondary | ICD-10-CM

## 2021-01-19 HISTORY — DX: Encounter for supervision of normal first pregnancy, second trimester: Z34.02

## 2021-01-19 LAB — POCT URINALYSIS DIPSTICK OB
Appearance: NORMAL
Bilirubin, UA: NEGATIVE
Blood, UA: NEGATIVE
Glucose, UA: NEGATIVE
Ketones, UA: NEGATIVE
Leukocytes, UA: NEGATIVE
Nitrite, UA: NEGATIVE
Odor: NORMAL
Spec Grav, UA: 1.02 (ref 1.010–1.025)
Urobilinogen, UA: 0.2 E.U./dL
pH, UA: 6 (ref 5.0–8.0)

## 2021-01-19 LAB — POCT URINE PREGNANCY: Preg Test, Ur: POSITIVE — AB

## 2021-01-19 NOTE — Progress Notes (Signed)
Ready Set Baby Information given today.  

## 2021-01-19 NOTE — Progress Notes (Signed)
New Obstetric Patient H&P   Chief Complaint: "Desires prenatal care"  History of Present Illness: Patient is a 25 y.o. G1P0000 Not Hispanic or Latino female, uncertain LMP 12/02/2020 presents with amenorrhea and positive home pregnancy test. Based on her  LMP, her EDD is Estimated Date of Delivery: 09/08/2021 and her EGA is [redacted]w[redacted]d. Cycles are regular, monthly, last between 4-5 days.  Her last pap smear was 2 or 3 years ago and was no abnormalities.    She had a urine pregnancy test which was positive 2 or 3 week(s)  ago. Her last menstrual period was short and lasted for  2 day(s). Since her LMP she claims she has experienced no issues. She denies vaginal bleeding. Her past medical history is noncontributory. This is her first pregnancy.  Since her LMP, she admits to the use of tobacco products  She did, but stopped.  She claims she has gained 5 pounds since the start of her pregnancy.  There are cats in the home in the home  no  She admits close contact with children on a regular basis  yes  She has had chicken pox in the past no She has had Tuberculosis exposures, symptoms, or previously tested positive for TB   no Current or past history of domestic violence. no  Genetic Screening/Teratology Counseling: (Includes patient, baby's father, or anyone in either family with:)   1. Patient's age >/= 49 at [facility]  no 2. Thalassemia ([place], [place], Mediterranean, or Asian background): MCV<80  no 3. Neural tube defect (meningomyelocele, spina bifida, anencephaly)  no 4. Congenital heart defect  no  5. Down syndrome  no 6. Tay-Sachs (Jewish, [place]  no 7. Canavan's Disease  no 8. Sickle cell disease or trait (African)  no  9. Hemophilia or other blood disorders  no  10. Muscular dystrophy  no  11. Cystic fibrosis  no  12. Huntington's Chorea  no  13. Mental retardation/autism  no 14. Other inherited genetic or chromosomal disorder  no 15. Maternal metabolic disorder (DM, PKU, etc)   no 16. Patient or FOB with a child with a birth defect not listed above no  16a. Patient or FOB with a birth defect themselves no 17. Recurrent pregnancy loss, or stillbirth  no  18. Any medications since LMP other than prenatal vitamins (include vitamins, supplements, OTC meds, drugs, alcohol)  no 19. Any other genetic/environmental exposure to discuss  no  Infection History:   1. Lives with someone with TB or TB exposed  no  2. Patient or partner has history of genital herpes  no 3. Rash or viral illness since LMP  no 4. History of STI (GC, CT, HPV, syphilis, HIV)  no 5. History of recent travel :  no  Other pertinent information:  no   Review of Systems:10 point review of systems negative unless otherwise noted in HPI  Past Medical History:  Diagnosis Date  . Migraine     Past Surgical History:  Procedure Laterality Date  . ADENOIDECTOMY    . TYMPANOSTOMY TUBE PLACEMENT     Gynecologic History: Patient's last menstrual period was 12/02/2020 (approximate).  Obstetric History: G1P0000  Family History  Problem Relation Age of Onset  . Ovarian cancer Other   . Colon cancer Other   . Healthy Mother   . Other Father        unknown medical history    Social History   Socioeconomic History  . Marital status: Single    Spouse name:  Not on file  . Number of children: Not on file  . Years of education: Not on file  . Highest education level: Not on file  Occupational History  . Not on file  Tobacco Use  . Smoking status: Never Smoker  . Smokeless tobacco: Never Used  Vaping Use  . Vaping Use: Never used  Substance and Sexual Activity  . Alcohol use: Yes    Comment: social  . Drug use: No  . Sexual activity: Yes  Other Topics Concern  . Not on file  Social History Narrative  . Not on file   Social Determinants of Health   Financial Resource Strain: Not on file  Food Insecurity: Not on file  Transportation Needs: Not on file  Physical Activity: Not on  file  Stress: Not on file  Social Connections: Not on file  Intimate Partner Violence: Not on file   Allergies: No Known Allergies  Prior to Admission medications: Denies    Physical Exam BP 100/64   Pulse 72   Wt 214 lb (97.1 kg)   LMP 12/02/2020 (Approximate)   BMI 33.52 kg/m   Physical Exam Constitutional:      General: She is not in acute distress.    Appearance: Normal appearance. She is well-developed.  Genitourinary:     Vulva, bladder and urethral meatus normal.     Right Labia: No rash, tenderness, lesions, skin changes or Bartholin's cyst.    Left Labia: No tenderness, skin changes, Bartholin's cyst or rash.    No inguinal adenopathy present in the right or left side.    Pelvic Tanner Score: 5/5.     Right Adnexa: not tender, not full and no mass present.    Left Adnexa: not tender, not full and no mass present.    No cervical motion tenderness, friability, lesion or polyp.     Uterus is not enlarged, fixed or tender.     Uterus is anteverted.     No urethral tenderness or mass present.     Pelvic exam was performed with patient in the lithotomy position.  HENT:     Head: Normocephalic and atraumatic.  Eyes:     General: No scleral icterus.    Conjunctiva/sclera: Conjunctivae normal.  Cardiovascular:     Rate and Rhythm: Normal rate and regular rhythm.     Heart sounds: No murmur heard. No friction rub. No gallop.   Pulmonary:     Effort: Pulmonary effort is normal. No respiratory distress.     Breath sounds: Normal breath sounds. No wheezing or rales.  Abdominal:     General: Bowel sounds are normal. There is no distension.     Palpations: Abdomen is soft. There is no mass.     Tenderness: There is no abdominal tenderness. There is no guarding or rebound.     Hernia: There is no hernia in the left inguinal area or right inguinal area.  Musculoskeletal:        General: Normal range of motion.     Cervical back: Normal range of motion and neck supple.   Lymphadenopathy:     Lower Body: No right inguinal adenopathy. No left inguinal adenopathy.  Neurological:     General: No focal deficit present.     Mental Status: She is alert and oriented to person, place, and time.     Cranial Nerves: No cranial nerve deficit.  Skin:    General: Skin is warm and dry.     Findings: No  erythema.  Psychiatric:        Mood and Affect: Mood normal.        Behavior: Behavior normal.        Judgment: Judgment normal.      Female Chaperone present during breast and/or pelvic exam.   Assessment: 25 y.o. G1P0000 at [redacted]w[redacted]d presenting to initiate prenatal care  Plan: 1) Avoid alcoholic beverages. 2) Patient encouraged not to smoke.  3) Discontinue the use of all non-medicinal drugs and chemicals.  4) Take prenatal vitamins daily.  5) Nutrition, food safety (fish, cheese advisories, and high nitrite foods) and exercise discussed. 6) Hospital and practice style discussed with cross coverage system.  7) Genetic Screening, such as with 1st Trimester Screening, cell free fetal DNA, AFP testing, and Ultrasound, as well as with amniocentesis and CVS as appropriate, is discussed with patient. At the conclusion of today's visit patient undecided about genetic testing 8) Patient is asked about travel to areas at risk for the Zika virus, and counseled to avoid travel and exposure to mosquitoes or sexual partners who may have themselves been exposed to the virus. Testing is discussed, and will be ordered as appropriate.   Thomasene Mohair, MD 01/19/2021 8:20 AM

## 2021-01-21 ENCOUNTER — Other Ambulatory Visit (HOSPITAL_COMMUNITY)
Admission: RE | Admit: 2021-01-21 | Discharge: 2021-01-21 | Disposition: A | Discharge status: Home / Self Care | Payer: Managed Care, Other (non HMO) | Source: Ambulatory Visit | Attending: Obstetrics and Gynecology | Admitting: Obstetrics and Gynecology

## 2021-01-21 DIAGNOSIS — Z113 Encounter for screening for infections with a predominantly sexual mode of transmission: Secondary | ICD-10-CM | POA: Diagnosis present

## 2021-01-21 DIAGNOSIS — O0990 Supervision of high risk pregnancy, unspecified, unspecified trimester: Secondary | ICD-10-CM | POA: Diagnosis not present

## 2021-01-21 DIAGNOSIS — Z124 Encounter for screening for malignant neoplasm of cervix: Secondary | ICD-10-CM | POA: Diagnosis present

## 2021-01-21 LAB — RPR+RH+ABO+RUB AB+AB SCR+CB...
Antibody Screen: NEGATIVE
HIV Screen 4th Generation wRfx: NONREACTIVE
Hematocrit: 39.3 % (ref 34.0–46.6)
Hemoglobin: 13.3 g/dL (ref 11.1–15.9)
Hepatitis B Surface Ag: NEGATIVE
MCH: 30 pg (ref 26.6–33.0)
MCHC: 33.8 g/dL (ref 31.5–35.7)
MCV: 89 fL (ref 79–97)
Platelets: 319 10*3/uL (ref 150–450)
RBC: 4.43 x10E6/uL (ref 3.77–5.28)
RDW: 12 % (ref 11.7–15.4)
RPR Ser Ql: NONREACTIVE
Rh Factor: POSITIVE
Rubella Antibodies, IGG: 1.07 index (ref >0.99)
Varicella zoster IgG: 930 index (ref >165)
WBC: 9.8 10*3/uL (ref 3.4–10.8)

## 2021-01-21 LAB — HGB FRACTIONATION CASCADE
Hgb A2: 2.6 % (ref 1.8–3.2)
Hgb A: 97.4 % (ref 96.4–98.8)
Hgb F: 0 % (ref 0.0–2.0)
Hgb S: 0 %

## 2021-01-21 LAB — URINE DRUG PANEL 7

## 2021-01-21 LAB — URINE CULTURE

## 2021-01-21 NOTE — Addendum Note (Signed)
Addended by: Thomasene Mohair D on: 01/21/2021 03:30 PM   Modules accepted: Orders

## 2021-01-26 ENCOUNTER — Other Ambulatory Visit: Payer: Self-pay

## 2021-01-26 ENCOUNTER — Other Ambulatory Visit: Payer: Self-pay | Admitting: Obstetrics and Gynecology

## 2021-01-26 ENCOUNTER — Other Ambulatory Visit: Payer: Managed Care, Other (non HMO)

## 2021-01-26 ENCOUNTER — Ambulatory Visit (INDEPENDENT_AMBULATORY_CARE_PROVIDER_SITE_OTHER): Payer: Managed Care, Other (non HMO) | Admitting: Advanced Practice Midwife

## 2021-01-26 ENCOUNTER — Encounter: Payer: Self-pay | Admitting: Advanced Practice Midwife

## 2021-01-26 ENCOUNTER — Ambulatory Visit (INDEPENDENT_AMBULATORY_CARE_PROVIDER_SITE_OTHER): Payer: Managed Care, Other (non HMO)

## 2021-01-26 VITALS — BP 120/80 | Wt 212.0 lb

## 2021-01-26 DIAGNOSIS — O0990 Supervision of high risk pregnancy, unspecified, unspecified trimester: Secondary | ICD-10-CM

## 2021-01-26 DIAGNOSIS — Z3A01 Less than 8 weeks gestation of pregnancy: Secondary | ICD-10-CM | POA: Diagnosis not present

## 2021-01-26 DIAGNOSIS — O99211 Obesity complicating pregnancy, first trimester: Secondary | ICD-10-CM

## 2021-01-26 DIAGNOSIS — Z3A09 9 weeks gestation of pregnancy: Secondary | ICD-10-CM

## 2021-01-26 DIAGNOSIS — Z131 Encounter for screening for diabetes mellitus: Secondary | ICD-10-CM

## 2021-01-26 DIAGNOSIS — O0991 Supervision of high risk pregnancy, unspecified, first trimester: Secondary | ICD-10-CM

## 2021-01-26 DIAGNOSIS — O99210 Obesity complicating pregnancy, unspecified trimester: Secondary | ICD-10-CM

## 2021-01-26 LAB — POCT URINALYSIS DIPSTICK OB
Glucose, UA: NEGATIVE
POC,PROTEIN,UA: NEGATIVE

## 2021-01-26 NOTE — Progress Notes (Signed)
  Routine Prenatal Care Visit  Subjective  Monique Reyes is a 25 y.o. G1P0000 at [redacted]w[redacted]d being seen today for ongoing prenatal care.  She is currently monitored for the following issues for this high-risk pregnancy and has Supervision of high risk pregnancy, antepartum and Obesity affecting pregnancy, antepartum on their problem list.  ----------------------------------------------------------------------------------- Patient reports no complaints.  She wonders if second hand vape smoke is bad for her/fetus. Discussed avoiding generally. Contractions: Not present. Vag. Bleeding: None.   . Leaking Fluid denies.  ----------------------------------------------------------------------------------- The following portions of the patient's history were reviewed and updated as appropriate: allergies, current medications, past family history, past medical history, past social history, past surgical history and problem list. Problem list updated.  Objective  Blood pressure 120/80, weight 212 lb (96.2 kg), last menstrual period 12/02/2020. Pregravid weight 208 lb (94.3 kg) Total Weight Gain 4 lb (1.814 kg) Urinalysis: Urine Protein Negative  Urine Glucose Negative  Fetal Status: Fetal Heart Rate (bpm): 170          Dating: EDD by ultrasound today, adjusted for 9 day difference  General:  Alert, oriented and cooperative. Patient is in no acute distress.  Skin: Skin is warm and dry. No rash noted.   Cardiovascular: Normal heart rate noted  Respiratory: Normal respiratory effort, no problems with respiration noted  Abdomen: Soft, gravid, appropriate for gestational age. Pain/Pressure: Absent     Pelvic:  Cervical exam deferred        Extremities: Normal range of motion.     Mental Status: Normal mood and affect. Normal behavior. Normal judgment and thought content.   Assessment   25 y.o. G1P0000 at [redacted]w[redacted]d by  08/30/2021, by Ultrasound presenting for routine prenatal visit  Plan   Pregnancy #1  Problems (from 01/19/21 to present)    Problem Noted Resolved   Supervision of high risk pregnancy, antepartum 01/19/2021 by Conard Novak, MD No   Overview Signed 01/26/2021 11:54 AM by Tresea Mall, CNM    [facility]inic [facility] Prenatal Labs  Dating By 9w u/s Blood type: A/Positive/-- (01/25 0935)   Genetic Screen 1 Screen:    AFP:     Quad:     NIPS: Antibody:Negative (01/25 0935)  Anatomic [place]  Rubella: 1.07 (01/25 0935)  Varicella: @VZVIGG @  GTT Early:                Third trimester:  RPR: Non Reactive (01/25 0935)   Rhogam  HBsAg: Negative (01/25 0935)   Vaccines TDAP:                       Flu Shot: Covid: HIV: Non Reactive (01/25 0935)   Baby Food                                GBS:   GC/CT:  Contraception  Pap: 2022  CBB     CS/VBAC    Support Person           Obesity affecting pregnancy, antepartum 01/19/2021 by 01/21/2021, MD No       Preterm labor symptoms and general obstetric precautions including but not limited to vaginal bleeding, contractions, leaking of fluid and fetal movement were reviewed in detail with the patient.   Return in about 2 weeks (around 02/09/2021) for rob and MaterniT 21.  02/11/2021, CNM 01/26/2021 12:03 PM

## 2021-01-27 LAB — GLUCOSE, 1 HOUR GESTATIONAL: Gestational Diabetes Screen: 112 mg/dL (ref 65–139)

## 2021-01-29 LAB — CYTOLOGY - PAP
Chlamydia: NEGATIVE
Comment: NEGATIVE
Comment: NEGATIVE
Comment: NEGATIVE
Comment: NORMAL
Diagnosis: UNDETERMINED — AB
High risk HPV: NEGATIVE
Neisseria Gonorrhea: NEGATIVE
Trichomonas: NEGATIVE

## 2021-02-09 ENCOUNTER — Ambulatory Visit (INDEPENDENT_AMBULATORY_CARE_PROVIDER_SITE_OTHER): Payer: Managed Care, Other (non HMO) | Admitting: Advanced Practice Midwife

## 2021-02-09 ENCOUNTER — Encounter: Payer: Self-pay | Admitting: Advanced Practice Midwife

## 2021-02-09 ENCOUNTER — Other Ambulatory Visit: Payer: Self-pay

## 2021-02-09 VITALS — BP 120/80 | Wt 213.0 lb

## 2021-02-09 DIAGNOSIS — Z369 Encounter for antenatal screening, unspecified: Secondary | ICD-10-CM

## 2021-02-09 DIAGNOSIS — O99211 Obesity complicating pregnancy, first trimester: Secondary | ICD-10-CM

## 2021-02-09 DIAGNOSIS — Z3A11 11 weeks gestation of pregnancy: Secondary | ICD-10-CM

## 2021-02-09 DIAGNOSIS — Z1379 Encounter for other screening for genetic and chromosomal anomalies: Secondary | ICD-10-CM

## 2021-02-09 DIAGNOSIS — O0991 Supervision of high risk pregnancy, unspecified, first trimester: Secondary | ICD-10-CM

## 2021-02-09 LAB — POCT URINALYSIS DIPSTICK OB: Glucose, UA: NEGATIVE

## 2021-02-09 NOTE — Addendum Note (Signed)
Addended by: Cornelius Moras D on: 02/09/2021 09:26 AM   Modules accepted: Orders

## 2021-02-09 NOTE — Patient Instructions (Signed)

## 2021-02-09 NOTE — Progress Notes (Signed)
  Routine Prenatal Care Visit  Subjective  Monique Reyes is a 25 y.o. G1P0000 at [redacted]w[redacted]d being seen today for ongoing prenatal care.  She is currently monitored for the following issues for this high-risk pregnancy and has Supervision of high risk pregnancy, antepartum and Obesity affecting pregnancy, antepartum on their problem list.  ----------------------------------------------------------------------------------- Patient reports no complaints.   Contractions: Not present. Vag. Bleeding: None.  Movement: Present. Leaking Fluid denies.  ----------------------------------------------------------------------------------- The following portions of the patient's history were reviewed and updated as appropriate: allergies, current medications, past family history, past medical history, past social history, past surgical history and problem list. Problem list updated.  Objective  Blood pressure 120/80, weight 213 lb (96.6 kg), last menstrual period 12/02/2020. Pregravid weight 208 lb (94.3 kg) Total Weight Gain 5 lb (2.268 kg) Urinalysis: Urine Protein    Urine Glucose    Fetal Status: Fetal Heart Rate (bpm): 164   Movement: Present      BS u/s due to difficulty finding FHTs with doppler: fetal movement seen and then able to hear heart tones  General:  Alert, oriented and cooperative. Patient is in no acute distress.  Skin: Skin is warm and dry. No rash noted.   Cardiovascular: Normal heart rate noted  Respiratory: Normal respiratory effort, no problems with respiration noted  Abdomen: Soft, gravid, appropriate for gestational age. Pain/Pressure: Absent     Pelvic:  Cervical exam deferred        Extremities: Normal range of motion.     Mental Status: Normal mood and affect. Normal behavior. Normal judgment and thought content.   Assessment   25 y.o. G1P0000 at [redacted]w[redacted]d by  08/30/2021, by Ultrasound presenting for routine prenatal visit  Plan   Pregnancy #1 Problems (from 01/19/21 to  present)    Problem Noted Resolved   Supervision of high risk pregnancy, antepartum 01/19/2021 by Conard Novak, MD No   Overview Signed 01/26/2021 11:54 AM by Tresea Mall, CNM    [facility]inic [facility] Prenatal Labs  Dating By 9w u/s Blood type: A/Positive/-- (01/25 0935)   Genetic Screen 1 Screen:    AFP:     Quad:     NIPS: Antibody:Negative (01/25 0935)  Anatomic [place]  Rubella: 1.07 (01/25 0935)  Varicella: @VZVIGG @  GTT Early:                Third trimester:  RPR: Non Reactive (01/25 0935)   Rhogam  HBsAg: Negative (01/25 0935)   Vaccines TDAP:                       Flu Shot: Covid: HIV: Non Reactive (01/25 0935)   Baby Food                                GBS:   GC/CT:  Contraception  Pap: 2022  CBB     CS/VBAC    Support Person           Obesity affecting pregnancy, antepartum 01/19/2021 by 01/21/2021, MD No       Preterm labor symptoms and general obstetric precautions including but not limited to vaginal bleeding, contractions, leaking of fluid and fetal movement were reviewed in detail with the patient. Please refer to After Visit Summary for other counseling recommendations.   Return in about 4 weeks (around 03/09/2021) for rob.  03/11/2021, CNM 02/09/2021 9:17 AM

## 2021-02-15 LAB — MATERNIT21 PLUS CORE+SCA
Fetal Fraction: 7
Monosomy X (Turner Syndrome): NOT DETECTED
Result (T21): NEGATIVE
Trisomy 13 (Patau syndrome): NEGATIVE
Trisomy 18 (Edwards syndrome): NEGATIVE
Trisomy 21 (Down syndrome): NEGATIVE
XXX (Triple X Syndrome): NOT DETECTED
XXY (Klinefelter Syndrome): NOT DETECTED
XYY (Jacobs Syndrome): NOT DETECTED

## 2021-03-09 ENCOUNTER — Other Ambulatory Visit: Payer: Self-pay

## 2021-03-09 ENCOUNTER — Ambulatory Visit (INDEPENDENT_AMBULATORY_CARE_PROVIDER_SITE_OTHER): Payer: Managed Care, Other (non HMO) | Admitting: Obstetrics and Gynecology

## 2021-03-09 VITALS — BP 118/70 | Wt 213.0 lb

## 2021-03-09 DIAGNOSIS — Z363 Encounter for antenatal screening for malformations: Secondary | ICD-10-CM

## 2021-03-09 DIAGNOSIS — O99210 Obesity complicating pregnancy, unspecified trimester: Secondary | ICD-10-CM

## 2021-03-09 DIAGNOSIS — O0990 Supervision of high risk pregnancy, unspecified, unspecified trimester: Secondary | ICD-10-CM

## 2021-03-09 DIAGNOSIS — Z3A15 15 weeks gestation of pregnancy: Secondary | ICD-10-CM

## 2021-03-09 LAB — POCT URINALYSIS DIPSTICK OB
Glucose, UA: NEGATIVE
POC,PROTEIN,UA: NEGATIVE

## 2021-03-09 NOTE — Progress Notes (Signed)
    Routine Prenatal Care Visit  Subjective  Monique Reyes is a 25 y.o. G1P0000 at [redacted]w[redacted]d being seen today for ongoing prenatal care.  She is currently monitored for the following issues for this low-risk pregnancy and has Supervision of high risk pregnancy, antepartum and Obesity affecting pregnancy, antepartum on their problem list.  ----------------------------------------------------------------------------------- Patient reports no complaints.   Contractions: Not present. Vag. Bleeding: None.  Movement: Present. Denies leaking of fluid.  ----------------------------------------------------------------------------------- The following portions of the patient's history were reviewed and updated as appropriate: allergies, current medications, past family history, past medical history, past social history, past surgical history and problem list. Problem list updated.   Objective  Blood pressure 118/70, weight 213 lb (96.6 kg), last menstrual period 12/02/2020.  Last menstrual period 12/02/2020. Pregravid weight 208 lb (94.3 kg) Total Weight Gain 5 lb (2.268 kg) Urinalysis:      Fetal Status: Fetal Heart Rate (bpm): 145   Movement: Present     General:  Alert, oriented and cooperative. Patient is in no acute distress.  Skin: Skin is warm and dry. No rash noted.   Cardiovascular: Normal heart rate noted  Respiratory: Normal respiratory effort, no problems with respiration noted  Abdomen: Soft, gravid, appropriate for gestational age. Pain/Pressure: Present     Pelvic:  Cervical exam deferred        Extremities: Normal range of motion.     ental Status: Normal mood and affect. Normal behavior. Normal judgment and thought content.     Assessment   25 y.o. G1P0000 at [redacted]w[redacted]d by  08/30/2021, by Ultrasound presenting for routine prenatal visit  Plan   Pregnancy #1 Problems (from 01/19/21 to present)    Problem Noted Resolved   Supervision of high risk pregnancy, antepartum 01/19/2021  by Conard Novak, MD No   Overview Signed 01/26/2021 11:54 AM by Tresea Mall, CNM    [facility] Prenatal Labs  Dating By 9w u/s Blood type: A/Positive/-- (01/25 0935)   Genetic Screen 1 Screen:    AFP:     Quad:     NIPS: Antibody:Negative (01/25 0935)  Anatomic [place]  Rubella: 1.07 (01/25 0935)  Varicella: @VZVIGG @  GTT Early:                Third trimester:  RPR: Non Reactive (01/25 0935)   Rhogam  HBsAg: Negative (01/25 0935)   Vaccines TDAP:                       Flu Shot: Covid: HIV: Non Reactive (01/25 0935)   Baby Food                                GBS:   GC/CT:  Contraception  Pap: 2022  CBB     CS/VBAC    Support Person           Obesity affecting pregnancy, antepartum 01/19/2021 by 01/21/2021, MD No       Gestational age appropriate obstetric precautions including but not limited to vaginal bleeding, contractions, leaking of fluid and fetal movement were reviewed in detail with the patient.    Return in about 4 weeks (around 04/06/2021) for ROB and anatomy scan (4-5 weeks).  06/06/2021, MD, [facility] [place] [facility], [facility] 03/09/2021, 9:22 AM

## 2021-03-16 ENCOUNTER — Telehealth: Payer: Self-pay

## 2021-03-16 NOTE — Telephone Encounter (Signed)
Pt calling; is there anything else she can take to help c a lot of mucus?; is taking musinex sinus, vapor rub, and zyrtec; not working; can't sleep; and when she gets up she throws up a lot of mucus; anything else?  [phone]  Adv pt plain sudafed, claritin instead of zyrtec, benadryl at night, sleep in recliner, drind 64oz water/d, saline nasal spray.

## 2021-03-18 ENCOUNTER — Telehealth: Payer: Self-pay

## 2021-03-18 ENCOUNTER — Emergency Department
Admission: EM | Admit: 2021-03-18 | Discharge: 2021-03-18 | Disposition: A | Discharge status: Home / Self Care | Payer: Managed Care, Other (non HMO) | Attending: Emergency Medicine | Admitting: Emergency Medicine

## 2021-03-18 ENCOUNTER — Other Ambulatory Visit: Payer: Self-pay

## 2021-03-18 DIAGNOSIS — K2970 Gastritis, unspecified, without bleeding: Secondary | ICD-10-CM

## 2021-03-18 DIAGNOSIS — R0981 Nasal congestion: Secondary | ICD-10-CM | POA: Diagnosis not present

## 2021-03-18 DIAGNOSIS — R079 Chest pain, unspecified: Secondary | ICD-10-CM | POA: Diagnosis not present

## 2021-03-18 DIAGNOSIS — R059 Cough, unspecified: Secondary | ICD-10-CM | POA: Diagnosis present

## 2021-03-18 LAB — CBC
HCT: 38.1 % (ref 36.0–46.0)
Hemoglobin: 12.7 g/dL (ref 12.0–15.0)
MCH: 29.7 pg (ref 26.0–34.0)
MCHC: 33.3 g/dL (ref 30.0–36.0)
MCV: 89 fL (ref 80.0–100.0)
Platelets: 322 10*3/uL (ref 150–400)
RBC: 4.28 MIL/uL (ref 3.87–5.11)
RDW: 13.6 % (ref 11.5–15.5)
WBC: 11.4 10*3/uL — ABNORMAL HIGH (ref 4.0–10.5)
nRBC: 0 % (ref 0.0–0.2)

## 2021-03-18 LAB — BASIC METABOLIC PANEL
Anion gap: 10 (ref 5–15)
BUN: 5 mg/dL — ABNORMAL LOW (ref 6–20)
CO2: 20 mmol/L — ABNORMAL LOW (ref 22–32)
Calcium: 9.1 mg/dL (ref 8.9–10.3)
Chloride: 103 mmol/L (ref 98–111)
Creatinine, Ser: 0.51 mg/dL (ref 0.44–1.00)
GFR, Estimated: >60 mL/min (ref >60)
Glucose, Bld: 83 mg/dL (ref 70–99)
Potassium: 3.2 mmol/L — ABNORMAL LOW (ref 3.5–5.1)
Sodium: 133 mmol/L — ABNORMAL LOW (ref 135–145)

## 2021-03-18 LAB — TROPONIN I (HIGH SENSITIVITY)
Troponin I (High Sensitivity): <2 ng/L (ref <18)
Troponin I (High Sensitivity): <2 ng/L (ref <18)

## 2021-03-18 MED ORDER — ONDANSETRON HCL 4 MG PO TABS: 4.0000 mg | ORAL_TABLET | Freq: Three times a day (TID) | ORAL | 0 refills | Status: DC | PRN

## 2021-03-18 MED ORDER — POTASSIUM CHLORIDE CRYS ER 20 MEQ PO TBCR
40.0000 meq | EXTENDED_RELEASE_TABLET | Freq: Once | ORAL | Status: AC
  Administered 2021-03-18: 40 meq via ORAL
  Filled 2021-03-18: qty 2

## 2021-03-18 MED ORDER — LIDOCAINE VISCOUS HCL 2 % MT SOLN
15.0000 mL | Freq: Once | OROMUCOSAL | Status: AC
  Administered 2021-03-18: 15 mL via ORAL
  Filled 2021-03-18: qty 15

## 2021-03-18 MED ORDER — PANTOPRAZOLE SODIUM 20 MG PO TBEC: 20.0000 mg | DELAYED_RELEASE_TABLET | Freq: Every day | ORAL | 1 refills | Status: DC

## 2021-03-18 MED ORDER — ALUM & MAG HYDROXIDE-SIMETH 200-200-20 MG/5ML PO SUSP
30.0000 mL | Freq: Once | ORAL | Status: AC
  Administered 2021-03-18: 30 mL via ORAL
  Filled 2021-03-18: qty 30

## 2021-03-18 NOTE — ED Triage Notes (Signed)
Pt states she is [redacted] weeks pregnant and has had nasal and sinus congestion. Pt states her OB said she can take benadryl and Claritin. Pt states it is not helping, but she will get a bad cough and get chest pain from the cough and then short of breath an hour later.  Pt also endorses nausea.

## 2021-03-18 NOTE — ED Notes (Signed)
E sign pad not working , pt gave ed  rn a verbal understanding on her dc instructions

## 2021-03-18 NOTE — Telephone Encounter (Signed)
Pt returned call; adv if having chest pain and trouble breathing she needs to go to the ED; they can help her with her breathing a lot better than we can.  Pt voiced understanding.

## 2021-03-18 NOTE — Discharge Instructions (Addendum)
Please seek medical attention for any high fevers, chest pain, shortness of breath, change in behavior, persistent vomiting, bloody stool or any other new or concerning symptoms.  

## 2021-03-18 NOTE — ED Notes (Signed)
EKG singed by Dr. Derrill Kay. RN unable to obtain FHT. Dr. Derrill Kay notified. No new orders provided.

## 2021-03-18 NOTE — Telephone Encounter (Signed)
Pt calling; otc meds for sinuses are not working; is getting worse; can't hold anything down, throat starting to hurt; chest hurts with starting to breathe; difficult to breathe; can she be seen and get rx?  [phone]  [facility]

## 2021-03-18 NOTE — ED Notes (Signed)
First nurse note presents with cough and sinus pressure  Also having some discomfort in chest with inspiration  Pt is 4 months preg

## 2021-03-18 NOTE — ED Provider Notes (Signed)
[facility] Emergency Department Provider Note   ____________________________________________   I have reviewed the triage vital signs and the nursing notes.   HISTORY  Chief Complaint Nasal Congestion and Cough   History limited by: Not Limited   HPI Monique Reyes is a 25 y.o. female who presents to the emergency department today because of concerns for nasal congestion, cough, vomiting and chest pain.  Patient states for roughly the past month she has been dealing with nasal congestion.  She has tried various over-the-counter medications without any significant relief.  She states that she has had an associated cough.  She has since developed some sharp pain in the center lower part of her chest.  This is somewhat associated with cough.  She has also developed nausea and vomiting.  Denies any fevers.  Denies any history of lung disease.  Records reviewed. Per medical record review patient has a history of pregnancy  Past Medical History:  Diagnosis Date  . Migraine     Patient Active Problem List   Diagnosis Date Noted  . Supervision of high risk pregnancy, antepartum 01/19/2021  . Obesity affecting pregnancy, antepartum 01/19/2021    Past Surgical History:  Procedure Laterality Date  . ADENOIDECTOMY    . TYMPANOSTOMY TUBE PLACEMENT      Prior to Admission medications   Medication Sig Start Date End Date Taking? Authorizing Provider  ondansetron (ZOFRAN ODT) 4 MG disintegrating tablet Take 1 tablet (4 mg total) by mouth every 8 (eight) hours as needed for nausea or vomiting. 06/27/20   Bailey Mech, NP  Prenatal Vit-Fe Fumarate-FA (MULTIVITAMIN-PRENATAL) 27-0.8 MG TABS tablet Take 1 tablet by mouth daily at 12 noon.    [provider]  levonorgestrel-ethinyl estradiol (SRONYX) 0.1-20 MG-MCG tablet Take 1 tablet by mouth daily. 12/20/18 02/11/20  Copland, Ilona Sorrel, PA-C    Allergies Patient has no known allergies.  Family History   Problem Relation Age of Onset  . Ovarian cancer Other   . Colon cancer Other   . Healthy Mother   . Other Father        unknown medical history    Social History Social History   Tobacco Use  . Smoking status: Never Smoker  . Smokeless tobacco: Never Used  Vaping Use  . Vaping Use: Never used  Substance Use Topics  . Alcohol use: Yes    Comment: social  . Drug use: No    Review of Systems Constitutional: No fever/chills Eyes: No visual changes. ENT: Positive for congestion.  Cardiovascular: Positive for chest pain. Respiratory: Positive for cough and shortness of breath. Gastrointestinal: No abdominal pain. Positive for nausea and vomiting.   Genitourinary: Negative for dysuria. Musculoskeletal: Negative for back pain. Skin: Negative for rash. Neurological: Negative for headaches, focal weakness or numbness.  ____________________________________________   PHYSICAL EXAM:  VITAL SIGNS: ED Triage Vitals [03/18/21 1621]  Enc Vitals Group     BP 132/73     Pulse Rate 92     Resp 19     Temp 98.7 F (37.1 C)     Temp src      SpO2 97 %     Weight 214 lb (97.1 kg)     Height 5\' 7"  (1.702 m)     Head Circumference      Peak Flow      Pain Score 0   Constitutional: Alert and oriented.  Eyes: Conjunctivae are normal.  ENT      Head: Normocephalic and  atraumatic.      Nose: No congestion/rhinnorhea.      Mouth/Throat: Mucous membranes are moist.      Neck: No stridor. Hematological/Lymphatic/Immunilogical: No cervical lymphadenopathy. Cardiovascular: Normal rate, regular rhythm.  No murmurs, rubs, or gallops.  Respiratory: Normal respiratory effort without tachypnea nor retractions. Breath sounds are clear and equal bilaterally. No wheezes/rales/rhonchi. Gastrointestinal: Soft and non tender. No rebound. No guarding.  Genitourinary: Deferred Musculoskeletal: Normal range of motion in all extremities. No lower extremity edema. Neurologic:  Normal speech and  language. No gross focal neurologic deficits are appreciated.  Skin:  Skin is warm, dry and intact. No rash noted. Psychiatric: Mood and affect are normal. Speech and behavior are normal. Patient exhibits appropriate insight and judgment.  ____________________________________________    LABS (pertinent positives/negatives)  Trop hs <2 CBC wbc 11.4, hgb 12.7, plt 322 BMP na 133, k 3.2, glu 83, cr 0.51  ____________________________________________   EKG  I, Phineas Semen, attending physician, personally viewed and interpreted this EKG  EKG Time: 1638 Rate: 72 Rhythm: normal sinus rhythm  Axis: normal Intervals: qtc 424 QRS: narrow ST changes: no st elevation Impression: normal ekg  ____________________________________________    RADIOLOGY  None  ____________________________________________   PROCEDURES  Procedures  ____________________________________________   INITIAL IMPRESSION / ASSESSMENT AND PLAN / ED COURSE  Pertinent labs & imaging results that were available during my care of the patient were reviewed by me and considered in my medical decision making (see chart for details).   Patient presented to the emergency department today because of concerns for cough and chest pain.  On exam patient did have occasional cough although did not appear to be in any respiratory distress. Blood work without any concerning leukocytosis, troponin negative. Patient did feel better after GI cocktail. Pain and coughing had both improved. Did discuss with patient possibility of obtaining imaging however given improvement and concern for radiation in pregnancy will defer imaging at this time. Will give patient prescription for antacid as well as dietary guidelines.   ____________________________________________   FINAL CLINICAL IMPRESSION(S) / ED DIAGNOSES  Final diagnoses:  Cough  Gastritis without bleeding, unspecified chronicity, unspecified gastritis type      Note: This dictation was prepared with Dragon dictation. Any transcriptional errors that result from this process are unintentional     Phineas Semen, MD 03/18/21 [phone]

## 2021-03-23 NOTE — Addendum Note (Signed)
Addended by: Kathlene Cote on: 03/23/2021 09:17 AM   Modules accepted: Orders

## 2021-03-25 ENCOUNTER — Other Ambulatory Visit: Payer: Self-pay

## 2021-03-25 ENCOUNTER — Ambulatory Visit
Admission: EM | Admit: 2021-03-25 | Discharge: 2021-03-25 | Disposition: A | Discharge status: Home / Self Care | Payer: Managed Care, Other (non HMO) | Attending: Emergency Medicine | Admitting: Emergency Medicine

## 2021-03-25 DIAGNOSIS — J069 Acute upper respiratory infection, unspecified: Secondary | ICD-10-CM

## 2021-03-25 MED ORDER — IPRATROPIUM BROMIDE 0.06 % NA SOLN: 2.0000 | Freq: Four times a day (QID) | NASAL | 12 refills | Status: DC

## 2021-03-25 NOTE — ED Triage Notes (Signed)
Patient states that she has been having nasal congestion and sinus congestion x over 1 month. Patient states that she was seen in the ED this week and was treated for acid reflux but she believes that they she has a sinus infection. States that she is on nausea medication as well.

## 2021-03-25 NOTE — Discharge Instructions (Addendum)
Perform sinus rinse with a NeilMed sinus rinse kit and distilled water 2-3 times a day to help alleviate your nasal congestion and cut down your postnasal drip.  Use the ipratropium nasal spray, 2 squirts in each nostril every 6 hours, as needed for nasal congestion and discharge.  Continue to increase your oral fluid intake to help keep your mucus thin.  Use over-the-counter plain Robitussin to help with your cough relief.  You can also use over-the-counter cough drops.  Follow-up with your PCP for any new or worsening symptoms.

## 2021-03-25 NOTE — ED Provider Notes (Signed)
[facility]    CSN: [id number] Arrival date & time: 03/25/21  1713      History   Chief Complaint Chief Complaint  Patient presents with  . Nasal Congestion    HPI Monique Reyes is a 25 y.o. female.   HPI   25 year old female here for evaluation of nasal congestion and sinus pressure.  Patient reports she is had symptoms for the last month.  Patient was evaluated in the ER earlier this week and was treated for acid reflux but she is convinced that she has a sinus infection.  Patient reports that she has nasal discharge that vacillates between clear and yellow and she is also experiencing a cough.  Patient denies fever, sore throat, ear pain or pressure, shortness of breath, or wheezing.  Patient is currently [redacted] weeks pregnant.  Past Medical History:  Diagnosis Date  . Migraine     Patient Active Problem List   Diagnosis Date Noted  . Supervision of high risk pregnancy, antepartum 01/19/2021  . Obesity affecting pregnancy, antepartum 01/19/2021    Past Surgical History:  Procedure Laterality Date  . ADENOIDECTOMY    . TYMPANOSTOMY TUBE PLACEMENT      OB History    Gravida  1   Para  0   Term  0   Preterm  0   AB  0   Living  0     SAB  0   IAB  0   Ectopic  0   Multiple  0   Live Births  0            Home Medications    Prior to Admission medications   Medication Sig Start Date End Date Taking? Authorizing Provider  ipratropium (ATROVENT) 0.06 % nasal spray Place 2 sprays into both nostrils 4 (four) times daily. 03/25/21  Yes Margarette Canada, NP  ondansetron (ZOFRAN ODT) 4 MG disintegrating tablet Take 1 tablet (4 mg total) by mouth every 8 (eight) hours as needed for nausea or vomiting. 06/27/20  Yes Gertie Baron, NP  ondansetron (ZOFRAN) 4 MG tablet Take 1 tablet (4 mg total) by mouth every 8 (eight) hours as needed. 03/18/21  Yes Nance Pear, MD  pantoprazole (PROTONIX) 20 MG tablet Take 1 tablet (20 mg total) by mouth  daily. 03/18/21 03/18/22 Yes Nance Pear, MD  Prenatal Vit-Fe Fumarate-FA (MULTIVITAMIN-PRENATAL) 27-0.8 MG TABS tablet Take 1 tablet by mouth daily at 12 noon.   Yes [provider]  levonorgestrel-ethinyl estradiol (SRONYX) 0.1-20 MG-MCG tablet Take 1 tablet by mouth daily. 12/20/18 4/96/75  Copland, Deirdre Evener, PA-C    Family History Family History  Problem Relation Age of Onset  . Ovarian cancer Other   . Colon cancer Other   . Healthy Mother   . Other Father        unknown medical history    Social History Social History   Tobacco Use  . Smoking status: Never Smoker  . Smokeless tobacco: Never Used  Vaping Use  . Vaping Use: Never used  Substance Use Topics  . Alcohol use: Yes    Comment: social  . Drug use: No     Allergies   Patient has no known allergies.   Review of Systems Review of Systems  Constitutional: Negative for activity change and fever.  HENT: Positive for congestion, postnasal drip, rhinorrhea and sinus pressure. Negative for ear pain and sore throat.   Respiratory: Positive for cough. Negative for shortness of breath and wheezing.  Gastrointestinal: Negative for diarrhea, nausea and vomiting.  Skin: Negative for rash.  Hematological: Negative.   Psychiatric/Behavioral: Negative.      Physical Exam Triage Vital Signs ED Triage Vitals  Enc Vitals Group     BP 03/25/21 1734 132/70     Pulse Rate 03/25/21 1734 83     Resp 03/25/21 1734 18     Temp 03/25/21 1734 98.4 F (36.9 C)     Temp Source 03/25/21 1734 Oral     SpO2 03/25/21 1734 98 %     Weight 03/25/21 1733 214 lb (97.1 kg)     Height 03/25/21 1733 '5\' 7"'  (1.702 m)     Head Circumference --      Peak Flow --      Pain Score 03/25/21 1733 7     Pain Loc --      Pain Edu? --      Excl. in [place]? --    No data found.  Updated Vital Signs BP 132/70 (BP Location: Left Arm)   Pulse 83   Temp 98.4 F (36.9 C) (Oral)   Resp 18   Ht '5\' 7"'  (1.702 m)   Wt 214 lb (97.1 kg)    LMP 12/02/2020 (Approximate)   SpO2 98%   BMI 33.52 kg/m   Visual Acuity Right Eye Distance:   Left Eye Distance:   Bilateral Distance:    Right Eye Near:   Left Eye Near:    Bilateral Near:     Physical Exam Vitals and nursing note reviewed.  Constitutional:      General: She is not in acute distress.    Appearance: Normal appearance.  HENT:     Head: Normocephalic and atraumatic.     Right Ear: Tympanic membrane, ear canal and external ear normal.     Left Ear: Tympanic membrane, ear canal and external ear normal.     Nose: Congestion present.     Mouth/Throat:     Mouth: Mucous membranes are moist.     Pharynx: Oropharynx is clear. No posterior oropharyngeal erythema.  Cardiovascular:     Rate and Rhythm: Normal rate and regular rhythm.     Pulses: Normal pulses.     Heart sounds: Normal heart sounds. No murmur heard. No gallop.   Pulmonary:     Effort: Pulmonary effort is normal.     Breath sounds: Normal breath sounds. No wheezing, rhonchi or rales.  Musculoskeletal:     Cervical back: Normal range of motion and neck supple.  Lymphadenopathy:     Cervical: No cervical adenopathy.  Skin:    General: Skin is warm and dry.     Capillary Refill: Capillary refill takes less than 2 seconds.     Findings: No erythema or rash.  Neurological:     General: No focal deficit present.     Mental Status: She is alert and oriented to person, place, and time.  Psychiatric:        Mood and Affect: Mood normal.        Behavior: Behavior normal.        Thought Content: Thought content normal.        Judgment: Judgment normal.      UC Treatments / Results  Labs (all labs ordered are listed, but only abnormal results are displayed) Labs Reviewed - No data to display  EKG   Radiology No results found.  Procedures Procedures (including critical care time)  Medications Ordered in UC Medications - No  data to display  Initial Impression / Assessment and Plan / UC  Course  I have reviewed the triage vital signs and the nursing notes.  Pertinent labs & imaging results that were available during my care of the patient were reviewed by me and considered in my medical decision making (see chart for details).   Patient is a very pleasant 25 year old female who is currently [redacted] weeks pregnant who is complaining of nasal congestion and sinus pressure for the last month.  Physical exam reveals erythematous and edematous nasal mucosa with scant clear nasal discharge.  Maxillary and frontal sinuses are minimally tender to percussion.  Posterior oropharynx shows clear postnasal drip but is free of erythema or injection.  Lungs are clear to auscultation all fields.  Patient's physical exam is consistent with upper respiratory infection, most likely viral given the lack of purulent discharge in both naris.  We will treat patient with sinus irrigation and Atrovent nasal spray to help with her symptoms.   Final Clinical Impressions(s) / UC Diagnoses   Final diagnoses:  Viral URI with cough     Discharge Instructions     Perform sinus rinse with a NeilMed sinus rinse kit and distilled water 2-3 times a day to help alleviate your nasal congestion and cut down your postnasal drip.  Use the ipratropium nasal spray, 2 squirts in each nostril every 6 hours, as needed for nasal congestion and discharge.  Continue to increase your oral fluid intake to help keep your mucus thin.  Use over-the-counter plain Robitussin to help with your cough relief.  You can also use over-the-counter cough drops.  Follow-up with your PCP for any new or worsening symptoms.    ED Prescriptions    Medication Sig Dispense Auth. Provider   ipratropium (ATROVENT) 0.06 % nasal spray Place 2 sprays into both nostrils 4 (four) times daily. 15 mL Margarette Canada, NP     PDMP not reviewed this encounter.   Margarette Canada, NP 03/25/21 [phone]

## 2021-03-31 ENCOUNTER — Other Ambulatory Visit: Payer: Self-pay

## 2021-03-31 ENCOUNTER — Ambulatory Visit (INDEPENDENT_AMBULATORY_CARE_PROVIDER_SITE_OTHER): Payer: Managed Care, Other (non HMO) | Admitting: Advanced Practice Midwife

## 2021-03-31 ENCOUNTER — Ambulatory Visit
Admission: EM | Admit: 2021-03-31 | Discharge: 2021-03-31 | Disposition: A | Discharge status: Home / Self Care | Payer: Managed Care, Other (non HMO) | Attending: Physician Assistant | Admitting: Physician Assistant

## 2021-03-31 ENCOUNTER — Encounter: Payer: Self-pay | Admitting: Advanced Practice Midwife

## 2021-03-31 VITALS — Ht 67.0 in | Wt 214.0 lb

## 2021-03-31 DIAGNOSIS — O0992 Supervision of high risk pregnancy, unspecified, second trimester: Secondary | ICD-10-CM

## 2021-03-31 DIAGNOSIS — J019 Acute sinusitis, unspecified: Secondary | ICD-10-CM | POA: Diagnosis not present

## 2021-03-31 DIAGNOSIS — Z3A18 18 weeks gestation of pregnancy: Secondary | ICD-10-CM

## 2021-03-31 DIAGNOSIS — R0981 Nasal congestion: Secondary | ICD-10-CM

## 2021-03-31 DIAGNOSIS — O99212 Obesity complicating pregnancy, second trimester: Secondary | ICD-10-CM

## 2021-03-31 DIAGNOSIS — R059 Cough, unspecified: Secondary | ICD-10-CM | POA: Diagnosis not present

## 2021-03-31 DIAGNOSIS — R0989 Other specified symptoms and signs involving the circulatory and respiratory systems: Secondary | ICD-10-CM

## 2021-03-31 DIAGNOSIS — E669 Obesity, unspecified: Secondary | ICD-10-CM

## 2021-03-31 MED ORDER — AMOXICILLIN-POT CLAVULANATE 875-125 MG PO TABS: 1.0000 | ORAL_TABLET | Freq: Two times a day (BID) | ORAL | 0 refills | Status: AC

## 2021-03-31 NOTE — Discharge Instructions (Addendum)
We will try Augmentin at this time since you have not tried antibiotic yet and symptoms have been worse over the past 3 weeks.  Continue over-the-counter medications that have been doing safe in pregnancy.  Is fine to continue the antihistamines, Mucinex and Robitussin as well to nasal sprays.  Follow-up with our department for any worsening of symptoms or with your OB/GYN.

## 2021-03-31 NOTE — ED Triage Notes (Signed)
Pt c/o nasal congestion, sore throat, cough and hoarseness. Pt states she is not sleeping due to the coughing. Pt states she is also having nausea and emesis, pt is currently pregnant. Pt reports emesis seems to be from all the mucus in her throat. Pt denies fevers, chills or other symptoms. Pt states she has been sick for several weeks now.

## 2021-03-31 NOTE — Progress Notes (Signed)
Routine Prenatal Care Visit- Virtual Visit  Subjective   Virtual Visit via Telephone Note  I connected with Monique Reyes on 03/31/21 at  9:26 AM EDT by telephone and verified that I am speaking with the correct person using two identifiers.   I discussed the limitations, risks, security and privacy concerns of performing an evaluation and management service by telephone and the availability of in person appointments. I also discussed with the patient that there may be a patient responsible charge related to this service. The patient expressed understanding and agreed to proceed.  The patient was at home I spoke with the patient from my  Office phone The names of people involved in this encounter were: Monique Reyes and myself Monique Reyes, CNM  Monique Reyes is a 25 y.o. G1P0000 at [redacted]w[redacted]d being seen today for ongoing prenatal care.  She is currently monitored for the following issues for this high-risk pregnancy and has Supervision of high risk pregnancy, antepartum and Obesity affecting pregnancy, antepartum on their problem list.  ----------------------------------------------------------------------------------- Patient reports still feeling bad since being seen at ER. Her symptoms started in the past few weeks, although the mucous started a couple months ago. Her current symptoms are sore throat, nasal congestion, coughing, vomiting (mostly mucous). She thinks the sore throat is due to the nasal drainage which causes her to cough and then vomit. She has tried anti-reflux, anti-emetic, robitussin, sudafed and benadryl without relief. We discussed other comfort measures/medications/strep throat swab at urgent care/when to go to ER.    Contractions: Not present. Vag. Bleeding: None.  Movement: Present. Denies leaking of fluid.  ----------------------------------------------------------------------------------- The following portions of the patient's history were reviewed and updated  as appropriate: allergies, current medications, past family history, past medical history, past social history, past surgical history and problem list. Problem list updated.   Objective  Height 5\' 7"  (1.702 m), weight 214 lb (97.1 kg), last menstrual period 12/02/2020. Pregravid weight 208 lb (94.3 kg) Total Weight Gain 6 lb (2.722 kg) Urinalysis:      Fetal Status:     Movement: Present     Physical Exam could not be performed. Because of the COVID-19 outbreak this visit was performed over the phone and not in person.   Assessment   25 y.o. G1P0000 at [redacted]w[redacted]d by  08/30/2021, by Ultrasound presenting for work-in prenatal visit  Plan   Pregnancy #1 Problems (from 01/19/21 to present)    Problem Noted Resolved   Supervision of high risk pregnancy, antepartum 01/19/2021 by 01/21/2021, MD No   Overview Signed 01/26/2021 11:54 AM by 03/26/2021, CNM    [facility] Prenatal Labs  Dating By 9w u/s Blood type: A/Positive/-- (01/25 0935)   Genetic Screen 1 Screen:    AFP:     Quad:     NIPS: Antibody:Negative (01/25 0935)  Anatomic 05-27-1982  Rubella: 1.07 (01/25 0935)  Varicella: @VZVIGG @  GTT Early:                Third trimester:  RPR: Non Reactive (01/25 0935)   Rhogam  HBsAg: Negative (01/25 0935)   Vaccines TDAP:                       Flu Shot: Covid: HIV: Non Reactive (01/25 0935)   Baby Food  GBS:   GC/CT:  Contraception  Pap: 2022  CBB     CS/VBAC    Support Person           Obesity affecting pregnancy, antepartum 01/19/2021 by Conard Novak, MD No       Gestational age appropriate obstetric precautions including but not limited to vaginal bleeding, contractions, leaking of fluid and fetal movement were reviewed in detail with the patient.     Follow Up Instructions: Treat symptoms: Mucinex Unisom Increase hydration Vitamin C Saline nasal spray Throat lozenges Extra rest Go to urgent care for strep throat swab Go to ER  for severe symptoms Work note- access by [employer]   I discussed the assessment and treatment plan with the patient. The patient was provided an opportunity to ask questions and all were answered. The patient agreed with the plan and demonstrated an understanding of the instructions.   The patient was advised to call back or seek an in-person evaluation if the symptoms worsen or if the condition fails to improve as anticipated.  I provided 15 minutes of non-face-to-face time during this encounter.  Return for scheduled prenatal.   Monique Reyes, CNM [facility] 03/31/2021, 9:46 AM

## 2021-03-31 NOTE — Progress Notes (Signed)
Pt states she feels awful. She says the sinus medicine is not working.

## 2021-03-31 NOTE — ED Provider Notes (Signed)
[facility]    CSN: [id number] Arrival date & time: 03/31/21  1030      History   Chief Complaint Chief Complaint  Patient presents with  . Nasal Congestion  . Cough    HPI LELAND STASZEWSKI is a 25 y.o. pregnant female presenting for 57-month history of nasal congestion, sinus pressure, sore throat and postnasal drainage, cough and voice hoarseness.  She also admits to fatigue and sleep disturbances.  Patient admits to increased nausea and vomiting associated with the coughing and postnasal drainage.  She denies any fever, body aches, chest pain, breathing difficulty, abdominal pain or diarrhea.  No sick contacts and no known exposure to COVID-19.  Patient has been seen multiple times for the symptoms including on 03/18/2021 in the ED, 03/25/2021 at [facility] urgent care and today with her OB/GYN.  Patient advised at those visits this is likely a viral infection and to take OTC meds that are safe in pregnancy including Mucinex, Unisom, vitamin C, saline nasal sprays, and throat lozenges.  Patient says she has been trying all thought in addition to it she has been taking Robitussin and Sudafed and nothing is helping her cough or improving her symptoms.  She says her symptoms of gotten worse over the past 3 weeks.  Patient concern for sinus infection.  She says she has tried everything else and has not helped.  Patient concerned she may need antibiotic.  No other complaints or concerns.  HPI  Past Medical History:  Diagnosis Date  . Migraine     Patient Active Problem List   Diagnosis Date Noted  . Supervision of high risk pregnancy, antepartum 01/19/2021  . Obesity affecting pregnancy, antepartum 01/19/2021    Past Surgical History:  Procedure Laterality Date  . ADENOIDECTOMY    . TYMPANOSTOMY TUBE PLACEMENT      OB History    Gravida  1   Para  0   Term  0   Preterm  0   AB  0   Living  0     SAB  0   IAB  0   Ectopic  0   Multiple  0   Live Births   0            Home Medications    Prior to Admission medications   Medication Sig Start Date End Date Taking? Authorizing Provider  amoxicillin-clavulanate (AUGMENTIN) 875-125 MG tablet Take 1 tablet by mouth every 12 (twelve) hours for 7 days. 03/31/21 04/07/21 Yes Eusebio Friendly B, PA-C  ipratropium (ATROVENT) 0.06 % nasal spray Place 2 sprays into both nostrils 4 (four) times daily. 03/25/21  Yes Becky Augusta, NP  ondansetron (ZOFRAN ODT) 4 MG disintegrating tablet Take 1 tablet (4 mg total) by mouth every 8 (eight) hours as needed for nausea or vomiting. 06/27/20  Yes Bailey Mech, NP  Prenatal Vit-Fe Fumarate-FA (MULTIVITAMIN-PRENATAL) 27-0.8 MG TABS tablet Take 1 tablet by mouth daily at 12 noon.   Yes [provider]  ondansetron (ZOFRAN) 4 MG tablet Take 1 tablet (4 mg total) by mouth every 8 (eight) hours as needed. 03/18/21   Phineas Semen, MD  pantoprazole (PROTONIX) 20 MG tablet Take 1 tablet (20 mg total) by mouth daily. 03/18/21 03/18/22  Phineas Semen, MD  levonorgestrel-ethinyl estradiol (SRONYX) 0.1-20 MG-MCG tablet Take 1 tablet by mouth daily. 12/20/18 02/11/20  Copland, Ilona Sorrel, PA-C    Family History Family History  Problem Relation Age of Onset  . Ovarian cancer Other   .  Colon cancer Other   . Healthy Mother   . Other Father        unknown medical history    Social History Social History   Tobacco Use  . Smoking status: Never Smoker  . Smokeless tobacco: Never Used  Vaping Use  . Vaping Use: Never used  Substance Use Topics  . Alcohol use: Yes    Comment: social  . Drug use: No     Allergies   Patient has no known allergies.   Review of Systems Review of Systems  Constitutional: Positive for fatigue. Negative for chills, diaphoresis and fever.  HENT: Positive for congestion, postnasal drip, rhinorrhea, sinus pressure and sore throat. Negative for ear pain.   Respiratory: Positive for cough. Negative for shortness of breath.    Gastrointestinal: Positive for nausea and vomiting. Negative for abdominal pain.  Musculoskeletal: Negative for arthralgias and myalgias.  Skin: Negative for rash.  Neurological: Negative for weakness and headaches.  Hematological: Negative for adenopathy.  Psychiatric/Behavioral: Positive for sleep disturbance.     Physical Exam Triage Vital Signs ED Triage Vitals  Enc Vitals Group     BP 03/31/21 1105 117/62     Pulse Rate 03/31/21 1105 90     Resp 03/31/21 1105 18     Temp 03/31/21 1105 98.5 F (36.9 C)     Temp Source 03/31/21 1105 Oral     SpO2 03/31/21 1105 99 %     Weight 03/31/21 1103 214 lb (97.1 kg)     Height 03/31/21 1103 5\' 7"  (1.702 m)     Head Circumference --      Peak Flow --      Pain Score 03/31/21 1103 5     Pain Loc --      Pain Edu? --      Excl. in GC? --    No data found.  Updated Vital Signs BP 117/62 (BP Location: Left Arm)   Pulse 90   Temp 98.5 F (36.9 C) (Oral)   Resp 18   Ht 5\' 7"  (1.702 m)   Wt 214 lb (97.1 kg)   LMP 12/02/2020 (Approximate)   SpO2 99%   BMI 33.52 kg/m       Physical Exam Vitals and nursing note reviewed.  Constitutional:      General: She is not in acute distress.    Appearance: Normal appearance. She is not ill-appearing or toxic-appearing.  HENT:     Head: Normocephalic and atraumatic.     Right Ear: Tympanic membrane, ear canal and external ear normal.     Left Ear: Tympanic membrane, ear canal and external ear normal.     Nose: Congestion and rhinorrhea (mild/moderate yellowish drainage) present.     Mouth/Throat:     Mouth: Mucous membranes are moist.     Pharynx: Oropharynx is clear. Posterior oropharyngeal erythema present.  Eyes:     General: No scleral icterus.       Right eye: No discharge.        Left eye: No discharge.     Conjunctiva/sclera: Conjunctivae normal.  Cardiovascular:     Rate and Rhythm: Normal rate and regular rhythm.     Heart sounds: Normal heart sounds.  Pulmonary:      Effort: Pulmonary effort is normal. No respiratory distress.     Breath sounds: Normal breath sounds. No wheezing, rhonchi or rales.  Musculoskeletal:     Cervical back: Neck supple.  Skin:    General: Skin is  dry.  Neurological:     General: No focal deficit present.     Mental Status: She is alert. Mental status is at baseline.     Motor: No weakness.     Gait: Gait normal.  Psychiatric:        Mood and Affect: Mood normal.        Behavior: Behavior normal.        Thought Content: Thought content normal.      UC Treatments / Results  Labs (all labs ordered are listed, but only abnormal results are displayed) Labs Reviewed - No data to display  EKG   Radiology No results found.  Procedures Procedures (including critical care time)  Medications Ordered in UC Medications - No data to display  Initial Impression / Assessment and Plan / UC Course  I have reviewed the triage vital signs and the nursing notes.  Pertinent labs & imaging results that were available during my care of the patient were reviewed by me and considered in my medical decision making (see chart for details).    25 year old pregnant female presenting for cough, congestion, sore throat for 2 months it has been worse over the past 3 weeks.  Has tried multiple over-the-counter medications without improvement and concerned she may need an antibiotic at this time.  All vital signs are normal and stable.  She does have congestion and light yellow rhinorrhea on exam.  Also moderate erythema and of throat and postnasal drainage.  Advised patient this could be all allergy related or a viral infection.  Since symptoms have been ongoing for 2 months and worse over the past 3 weeks and she has not tried antibiotic, will try Augmentin at this time.  Advised her to continue with the over-the-counter antihistamines, increasing rest and hydration, throat lozenges and nasal sprays and Mucinex or Robitussin for the cough.   Return and ED precautions reviewed with patient.  Final Clinical Impressions(s) / UC Diagnoses   Final diagnoses:  Acute sinusitis, recurrence not specified, unspecified location  Nasal congestion  Cough     Discharge Instructions     We will try Augmentin at this time since you have not tried antibiotic yet and symptoms have been worse over the past 3 weeks.  Continue over-the-counter medications that have been doing safe in pregnancy.  Is fine to continue the antihistamines, Mucinex and Robitussin as well to nasal sprays.  Follow-up with our department for any worsening of symptoms or with your OB/GYN.    ED Prescriptions    Medication Sig Dispense Auth. Provider   amoxicillin-clavulanate (AUGMENTIN) 875-125 MG tablet Take 1 tablet by mouth every 12 (twelve) hours for 7 days. 14 tablet Gareth Morgan     PDMP not reviewed this encounter.   Shirlee Latch, PA-C 03/31/21 1133

## 2021-04-12 ENCOUNTER — Other Ambulatory Visit: Payer: Self-pay

## 2021-04-12 ENCOUNTER — Ambulatory Visit
Admission: RE | Admit: 2021-04-12 | Discharge: 2021-04-12 | Disposition: A | Discharge status: Home / Self Care | Payer: Managed Care, Other (non HMO) | Source: Ambulatory Visit | Attending: Obstetrics and Gynecology | Admitting: Obstetrics and Gynecology

## 2021-04-12 DIAGNOSIS — Z363 Encounter for antenatal screening for malformations: Secondary | ICD-10-CM | POA: Diagnosis not present

## 2021-04-14 ENCOUNTER — Ambulatory Visit (INDEPENDENT_AMBULATORY_CARE_PROVIDER_SITE_OTHER): Payer: Managed Care, Other (non HMO) | Admitting: Obstetrics

## 2021-04-14 ENCOUNTER — Other Ambulatory Visit: Payer: Self-pay

## 2021-04-14 VITALS — BP 110/70 | Wt 214.0 lb

## 2021-04-14 DIAGNOSIS — O0990 Supervision of high risk pregnancy, unspecified, unspecified trimester: Secondary | ICD-10-CM

## 2021-04-14 DIAGNOSIS — O0992 Supervision of high risk pregnancy, unspecified, second trimester: Secondary | ICD-10-CM

## 2021-04-14 NOTE — Progress Notes (Signed)
  Routine Prenatal Care Visit  Subjective  Monique Reyes is a 25 y.o. G1P0000 at [redacted]w[redacted]d being seen today for ongoing prenatal care.  She is currently monitored for the following issues for this low-risk pregnancy and has Supervision of high risk pregnancy, antepartum and Obesity affecting pregnancy, antepartum on their problem list.  ----------------------------------------------------------------------------------- Patient reports no complaints.  She is having a gierl. Works [employer]. Has named the baby Addison Contractions: Not present. Vag. Bleeding: None.  Movement: Absent. Leaking Fluid denies.  ----------------------------------------------------------------------------------- The following portions of the patient's history were reviewed and updated as appropriate: allergies, current medications, past family history, past medical history, past social history, past surgical history and problem list. Problem list updated.  Objective  Blood pressure 110/70, weight 214 lb (97.1 kg), last menstrual period 12/02/2020. Pregravid weight 208 lb (94.3 kg) Total Weight Gain 6 lb (2.722 kg) Urinalysis: Urine Protein    Urine Glucose    Fetal Status:     Movement: Absent     General:  Alert, oriented and cooperative. Patient is in no acute distress.  Skin: Skin is warm and dry. No rash noted.   Cardiovascular: Normal heart rate noted  Respiratory: Normal respiratory effort, no problems with respiration noted  Abdomen: Soft, gravid, appropriate for gestational age. Pain/Pressure: Absent     Pelvic:  Cervical exam deferred        Extremities: Normal range of motion.     Mental Status: Normal mood and affect. Normal behavior. Normal judgment and thought content.   Assessment   25 y.o. G1P0000 at [redacted]w[redacted]d by  08/30/2021, by Ultrasound presenting for routine prenatal visit  Plan   Pregnancy #1 Problems (from 01/19/21 to present)    Problem Noted Resolved   Supervision of high risk  pregnancy, antepartum 01/19/2021 by Conard Novak, MD No   Overview Signed 01/26/2021 11:54 AM by Tresea Mall, CNM    [facility]inic [facility] Prenatal Labs  Dating By 9w u/s Blood type: A/Positive/-- (01/25 0935)   Genetic Screen 1 Screen:    AFP:     Quad:     NIPS: Antibody:Negative (01/25 0935)  Anatomic [place]  Rubella: 1.07 (01/25 0935)  Varicella: @VZVIGG @  GTT Early:                Third trimester:  RPR: Non Reactive (01/25 0935)   Rhogam  HBsAg: Negative (01/25 0935)   Vaccines TDAP:                       Flu Shot: Covid: HIV: Non Reactive (01/25 0935)   Baby Food                                GBS:   GC/CT:  Contraception  Pap: 2022  CBB     CS/VBAC    Support Person           Obesity affecting pregnancy, antepartum 01/19/2021 by 01/21/2021, MD No       Preterm labor symptoms and general obstetric precautions including but not limited to vaginal bleeding, contractions, leaking of fluid and fetal movement were reviewed in detail with the patient. Please refer to After Visit Summary for other counseling recommendations.  Discussed breastfeeding today- benefits, how to learn about getting started.  Return in about 4 weeks (around 05/12/2021) for return OB.  05/14/2021, CNM  04/14/2021 4:01 PM

## 2021-05-12 ENCOUNTER — Encounter: Payer: Managed Care, Other (non HMO) | Admitting: Obstetrics and Gynecology

## 2021-05-25 ENCOUNTER — Encounter: Payer: Managed Care, Other (non HMO) | Admitting: Advanced Practice Midwife

## 2021-05-26 ENCOUNTER — Other Ambulatory Visit: Payer: Self-pay

## 2021-05-26 ENCOUNTER — Ambulatory Visit (INDEPENDENT_AMBULATORY_CARE_PROVIDER_SITE_OTHER): Payer: Managed Care, Other (non HMO) | Admitting: Obstetrics and Gynecology

## 2021-05-26 VITALS — BP 104/60 | Wt 229.0 lb

## 2021-05-26 DIAGNOSIS — Z131 Encounter for screening for diabetes mellitus: Secondary | ICD-10-CM

## 2021-05-26 DIAGNOSIS — Z3A26 26 weeks gestation of pregnancy: Secondary | ICD-10-CM

## 2021-05-26 DIAGNOSIS — Z3402 Encounter for supervision of normal first pregnancy, second trimester: Secondary | ICD-10-CM

## 2021-05-26 NOTE — Progress Notes (Signed)
Routine Prenatal Care Visit  Subjective  Monique Reyes is a 25 y.o. G1P0000 at [redacted]w[redacted]d being seen today for ongoing prenatal care.  She is currently monitored for the following issues for this low-risk pregnancy and has Encounter for supervision of normal first pregnancy in second trimester and Obesity affecting pregnancy, antepartum on their problem list.  ----------------------------------------------------------------------------------- Patient reports intermittent pulling sensation in lower, bilateral abdomen.  Contractions: Not present. Vag. Bleeding: None.  Movement: Present. Denies leaking of fluid.  ----------------------------------------------------------------------------------- The following portions of the patient's history were reviewed and updated as appropriate: allergies, current medications, past family history, past medical history, past social history, past surgical history and problem list. Problem list updated.  Objective  Blood pressure 104/60, weight 229 lb (103.9 kg), last menstrual period 12/02/2020. Pregravid weight 208 lb (94.3 kg) Total Weight Gain 21 lb (9.526 kg) Urinalysis:      Fetal Status: Fetal Heart Rate (bpm): 140 Fundal Height: 27 cm Movement: Present     General:  Alert, oriented and cooperative. Patient is in no acute distress.  Skin: Skin is warm and dry. No rash noted.   Cardiovascular: Normal heart rate noted  Respiratory: Normal respiratory effort, no problems with respiration noted  Abdomen: Soft, gravid, appropriate for gestational age. Pain/Pressure: Absent     Pelvic:  Cervical exam deferred        Extremities: Normal range of motion.  Edema: None  ental Status: Normal mood and affect. Normal behavior. Normal judgment and thought content.     Assessment   25 y.o. G1P0000 at [redacted]w[redacted]d by  08/30/2021, by Ultrasound presenting for routine prenatal visit  Plan   Pregnancy #1 Problems (from 01/19/21 to present)    Problem Noted Resolved    Supervision of high risk pregnancy, antepartum 01/19/2021 by Conard Novak, MD No   Overview Addendum 05/26/2021  4:27 PM by Zipporah Plants, CNM    [facility] Prenatal Labs  Dating By 9w u/s Blood type: A/Positive/-- (01/25 0935)   Genetic Screen  declined Antibody:Negative (01/25 0935)  Anatomic [place] Normal female Addison Rubella: 1.07 (01/25 0935)  Varicella: non-immune  GTT Early:  112              Third trimester:  RPR: Non Reactive (01/25 0935)   Rhogam  HBsAg: Negative (01/25 0935)   Vaccines TDAP:                       Flu Shot: Covid: HIV: Non Reactive (01/25 0935)   Baby Food                                GBS:   GC/CT:  Contraception  unsure - possibly POP Pap: 2022  Childbirth Ed   resources provided, declined   CS/VBAC    Support Person  Clifton Custard, husband          Previous Version   Obesity affecting pregnancy, antepartum 01/19/2021 by Conard Novak, MD No     -1h GTT/third trimester labs at next visit -Discussed round ligament pain - recommendation for belly band/materntiy belt, position changes, tylenol, warm epsom salt baths   Second trimester precautions including but not limited to vaginal bleeding, contractions, leaking of fluid and fetal movement were reviewed in detail with the patient.    Return in about 2 weeks (around 06/09/2021) for ROB with 1h GTT.  Zipporah Plants, CNM, MSN [facility],  [facility] 05/26/2021, 4:30 PM

## 2021-05-27 NOTE — Addendum Note (Signed)
Addended byZipporah Plants on: 05/27/2021 08:29 AM   Modules accepted: Orders

## 2021-06-09 ENCOUNTER — Encounter: Payer: Self-pay | Admitting: Obstetrics and Gynecology

## 2021-06-09 ENCOUNTER — Ambulatory Visit (INDEPENDENT_AMBULATORY_CARE_PROVIDER_SITE_OTHER): Payer: Managed Care, Other (non HMO) | Admitting: Obstetrics and Gynecology

## 2021-06-09 ENCOUNTER — Other Ambulatory Visit: Payer: Self-pay

## 2021-06-09 ENCOUNTER — Other Ambulatory Visit: Payer: Managed Care, Other (non HMO)

## 2021-06-09 VITALS — BP 110/60 | Wt 231.0 lb

## 2021-06-09 DIAGNOSIS — Z3A28 28 weeks gestation of pregnancy: Secondary | ICD-10-CM

## 2021-06-09 DIAGNOSIS — Z131 Encounter for screening for diabetes mellitus: Secondary | ICD-10-CM

## 2021-06-09 DIAGNOSIS — Z3403 Encounter for supervision of normal first pregnancy, third trimester: Secondary | ICD-10-CM

## 2021-06-09 LAB — POCT URINALYSIS DIPSTICK OB
Glucose, UA: NEGATIVE
POC,PROTEIN,UA: NEGATIVE

## 2021-06-09 NOTE — Addendum Note (Signed)
Addended by: Donnetta Hail on: 06/09/2021 09:09 AM   Modules accepted: Orders

## 2021-06-09 NOTE — Progress Notes (Signed)
Routine Prenatal Care Visit  Subjective  Monique Reyes is a 25 y.o. G1P0000 at [redacted]w[redacted]d being seen today for ongoing prenatal care.  She is currently monitored for the following issues for this low-risk pregnancy and has Encounter for supervision of normal first pregnancy in second trimester and Obesity affecting pregnancy, antepartum on their problem list.  ----------------------------------------------------------------------------------- Patient reports no complaints.   Contractions: Not present. Vag. Bleeding: None.  Movement: Present. Denies leaking of fluid.  ----------------------------------------------------------------------------------- The following portions of the patient's history were reviewed and updated as appropriate: allergies, current medications, past family history, past medical history, past social history, past surgical history and problem list. Problem list updated.   Objective  Blood pressure 110/60, weight 231 lb (104.8 kg), last menstrual period 12/02/2020. Pregravid weight 208 lb (94.3 kg) Total Weight Gain 23 lb (10.4 kg) Urinalysis:      Fetal Status: Fetal Heart Rate (bpm): 145 Fundal Height: 29 cm Movement: Present     General:  Alert, oriented and cooperative. Patient is in no acute distress.  Skin: Skin is warm and dry. No rash noted.   Cardiovascular: Normal heart rate noted  Respiratory: Normal respiratory effort, no problems with respiration noted  Abdomen: Soft, gravid, appropriate for gestational age. Pain/Pressure: Absent     Pelvic:  Cervical exam deferred        Extremities: Normal range of motion.  Edema: None  ental Status: Normal mood and affect. Normal behavior. Normal judgment and thought content.    Assessment   25 y.o. G1P0000 at [redacted]w[redacted]d by  08/30/2021, by Ultrasound presenting for routine prenatal visit  Plan   Pregnancy #1 Problems (from 01/19/21 to present)     Problem Noted Resolved   Encounter for supervision of normal  first pregnancy in second trimester 01/19/2021 by Conard Novak, MD No   Overview Addendum 05/26/2021  4:27 PM by Zipporah Plants, CNM    [facility] Prenatal Labs  Dating By 9w u/s Blood type: A/Positive/-- (01/25 0935)   Genetic Screen  declined Antibody:Negative (01/25 0935)  Anatomic [place] Normal female Addison Rubella: 1.07 (01/25 0935)  Varicella: non-immune  GTT Early:  112              Third trimester:  RPR: Non Reactive (01/25 0935)   Rhogam  HBsAg: Negative (01/25 0935)   Vaccines TDAP:                       Flu Shot: Covid: HIV: Non Reactive (01/25 0935)   Baby Food                                GBS:   GC/CT:  Contraception  unsure - possibly POP Pap: 2022  Childbirth Ed   resources provided, declined   CS/VBAC    Support Person  Clifton Custard, husband           Obesity affecting pregnancy, antepartum 01/19/2021 by Conard Novak, MD No       -1h GTT and third trimester labs today - reviewed screening process for GDM -Order placed for third trimester growth [place]  Preterm precautions including but not limited to vaginal bleeding, contractions, leaking of fluid and fetal movement were reviewed in detail with the patient.    Return in about 2 weeks (around 06/23/2021) for ROB.  Zipporah Plants, CNM, MSN [facility], [facility] 06/09/2021, 8:54 AM

## 2021-06-10 ENCOUNTER — Other Ambulatory Visit: Payer: Self-pay | Admitting: Obstetrics and Gynecology

## 2021-06-10 DIAGNOSIS — R7309 Other abnormal glucose: Secondary | ICD-10-CM

## 2021-06-10 LAB — 28 WEEK RH+PANEL
Basophils Absolute: 0.1 10*3/uL (ref 0.0–0.2)
Basos: 1 %
EOS (ABSOLUTE): 0 10*3/uL (ref 0.0–0.4)
Eos: 0 %
Gestational Diabetes Screen: 151 mg/dL — ABNORMAL HIGH (ref 65–139)
HIV Screen 4th Generation wRfx: NONREACTIVE
Hematocrit: 32.9 % — ABNORMAL LOW (ref 34.0–46.6)
Hemoglobin: 10.7 g/dL — ABNORMAL LOW (ref 11.1–15.9)
Immature Grans (Abs): 0.4 10*3/uL — ABNORMAL HIGH (ref 0.0–0.1)
Immature Granulocytes: 4 %
Lymphocytes Absolute: 2.6 10*3/uL (ref 0.7–3.1)
Lymphs: 23 %
MCH: 29.7 pg (ref 26.6–33.0)
MCHC: 32.5 g/dL (ref 31.5–35.7)
MCV: 91 fL (ref 79–97)
Monocytes Absolute: 0.8 10*3/uL (ref 0.1–0.9)
Monocytes: 7 %
Neutrophils Absolute: 7.3 10*3/uL — ABNORMAL HIGH (ref 1.4–7.0)
Neutrophils: 65 %
Platelets: 358 10*3/uL (ref 150–450)
RBC: 3.6 x10E6/uL — ABNORMAL LOW (ref 3.77–5.28)
RDW: 12.6 % (ref 11.7–15.4)
RPR Ser Ql: NONREACTIVE
WBC: 11.2 10*3/uL — ABNORMAL HIGH (ref 3.4–10.8)

## 2021-06-10 NOTE — Progress Notes (Signed)
Spoke with patient via phone. Reviewed third trimester labs including 1h GTT. Discussed recommendation for completion of 3h GTT. Patient stated understanding. All questions answered. Message sent to scheduler to call patient and schedule lab visit.

## 2021-06-14 ENCOUNTER — Telehealth: Payer: Self-pay

## 2021-06-14 NOTE — Telephone Encounter (Signed)
Pt calling to see if cortisone cream antifungal is okay to use during the 3rd trimester.  [phone]  Pt aware per AMS ok to use.

## 2021-06-23 ENCOUNTER — Ambulatory Visit (INDEPENDENT_AMBULATORY_CARE_PROVIDER_SITE_OTHER): Payer: Managed Care, Other (non HMO) | Admitting: Obstetrics and Gynecology

## 2021-06-23 ENCOUNTER — Encounter: Payer: Self-pay | Admitting: Obstetrics and Gynecology

## 2021-06-23 ENCOUNTER — Other Ambulatory Visit: Payer: Self-pay

## 2021-06-23 VITALS — BP 106/70 | Ht 67.0 in | Wt 234.6 lb

## 2021-06-23 DIAGNOSIS — Z3402 Encounter for supervision of normal first pregnancy, second trimester: Secondary | ICD-10-CM

## 2021-06-23 NOTE — Progress Notes (Signed)
    Routine Prenatal Care Visit  Subjective  Monique Reyes is a 25 y.o. G1P0000 at [redacted]w[redacted]d being seen today for ongoing prenatal care.  She is currently monitored for the following issues for this low-risk pregnancy and has Encounter for supervision of normal first pregnancy in second trimester and Obesity affecting pregnancy, antepartum on their problem list.  ----------------------------------------------------------------------------------- Patient reports no complaints.   Contractions: Not present. Vag. Bleeding: None.  Movement: Present. Denies leaking of fluid.  ----------------------------------------------------------------------------------- The following portions of the patient's history were reviewed and updated as appropriate: allergies, current medications, past family history, past medical history, past social history, past surgical history and problem list. Problem list updated.   Objective  Blood pressure 106/70, height 5\' 7"  (1.702 m), weight 234 lb 9.6 oz (106.4 kg), last menstrual period 12/02/2020. Pregravid weight 208 lb (94.3 kg) Total Weight Gain 26 lb 9.6 oz (12.1 kg) Urinalysis:      Fetal Status:     Movement: Present     General:  Alert, oriented and cooperative. Patient is in no acute distress.  Skin: Skin is warm and dry. No rash noted.   Cardiovascular: Normal heart rate noted  Respiratory: Normal respiratory effort, no problems with respiration noted  Abdomen: Soft, gravid, appropriate for gestational age. Pain/Pressure: Absent     Pelvic:  Cervical exam deferred        Extremities: Normal range of motion.     Mental Status: Normal mood and affect. Normal behavior. Normal judgment and thought content.   Assessment   25 y.o. G1P0000 at [redacted]w[redacted]d by  08/30/2021, by Ultrasound presenting for routine prenatal visit  Plan   Pregnancy #1 Problems (from 01/19/21 to present)     Problem Noted Resolved   Encounter for supervision of normal first pregnancy in  second trimester 01/19/2021 by 01/21/2021, MD No   Overview Addendum 06/23/2021  4:58 PM by 06/25/2021, MD    [facility] Prenatal Labs  Dating By 9w u/s Blood type: A/Positive/-- (01/25 0935)   Genetic Screen  declined Antibody:Negative (01/25 0935)  Anatomic 05-27-1982 Normal female Addison Rubella: 1.07 (01/25 0935)  Varicella: non-immune  GTT Early:  112              Third trimester:  RPR: Non Reactive (01/25 0935)   Rhogam Not needed  HBsAg: Negative (01/25 0935)   Vaccines TDAP:    06/23/2021                 Flu Shot: Covid:unvaccinated HIV: Non Reactive (01/25 0935)   Baby Food   Breast                             GBS:   GC/CT:  Contraception  unsure - possibly POP Pap: 2022  Childbirth Ed   resources provided, declined   CS/VBAC    Support Person  2023, husband           Obesity affecting pregnancy, antepartum 01/19/2021 by 01/21/2021, MD No       TDAP today Patient has 3 hr testing tomorrow  Gestational age appropriate obstetric precautions including but not limited to vaginal bleeding, contractions, leaking of fluid and fetal movement were reviewed in detail with the patient.    Return in about 1 week (around 06/30/2021) for ROB in person.  08/31/2021 MD [facility], [facility] 06/23/2021, 4:59 PM

## 2021-06-24 ENCOUNTER — Other Ambulatory Visit: Payer: Managed Care, Other (non HMO)

## 2021-06-24 DIAGNOSIS — R7309 Other abnormal glucose: Secondary | ICD-10-CM

## 2021-06-25 ENCOUNTER — Other Ambulatory Visit: Payer: Self-pay | Admitting: Obstetrics & Gynecology

## 2021-06-25 DIAGNOSIS — R7309 Other abnormal glucose: Secondary | ICD-10-CM

## 2021-06-25 LAB — GESTATIONAL GLUCOSE TOLERANCE
Glucose, Fasting: 95 mg/dL — ABNORMAL HIGH (ref 65–94)
Glucose, GTT - 1 Hour: 216 mg/dL — ABNORMAL HIGH (ref 65–179)
Glucose, GTT - 2 Hour: 185 mg/dL — ABNORMAL HIGH (ref 65–154)
Glucose, GTT - 3 Hour: 172 mg/dL — ABNORMAL HIGH (ref 65–139)

## 2021-06-25 NOTE — Progress Notes (Signed)
Referral fro [facility] due to dx Gest Diabetes

## 2021-06-26 ENCOUNTER — Other Ambulatory Visit: Payer: Self-pay | Admitting: Obstetrics & Gynecology

## 2021-06-26 DIAGNOSIS — O24419 Gestational diabetes mellitus in pregnancy, unspecified control: Secondary | ICD-10-CM

## 2021-06-29 ENCOUNTER — Other Ambulatory Visit: Payer: Self-pay | Admitting: Obstetrics and Gynecology

## 2021-06-29 NOTE — Progress Notes (Signed)
Called pharmacy for meter, test strips and lancet prescription. They said one touch ultra is covered.   Adelene Idler MD, [facility], [facility] 06/29/2021 12:43 PM

## 2021-07-02 ENCOUNTER — Other Ambulatory Visit: Payer: Self-pay

## 2021-07-02 ENCOUNTER — Ambulatory Visit (INDEPENDENT_AMBULATORY_CARE_PROVIDER_SITE_OTHER): Payer: Managed Care, Other (non HMO) | Admitting: Obstetrics

## 2021-07-02 VITALS — BP 124/70 | Ht 67.0 in | Wt 235.4 lb

## 2021-07-02 DIAGNOSIS — Z3A31 31 weeks gestation of pregnancy: Secondary | ICD-10-CM

## 2021-07-02 DIAGNOSIS — Z3403 Encounter for supervision of normal first pregnancy, third trimester: Secondary | ICD-10-CM

## 2021-07-02 DIAGNOSIS — O24419 Gestational diabetes mellitus in pregnancy, unspecified control: Secondary | ICD-10-CM | POA: Insufficient documentation

## 2021-07-02 DIAGNOSIS — O24410 Gestational diabetes mellitus in pregnancy, diet controlled: Secondary | ICD-10-CM

## 2021-07-02 NOTE — Progress Notes (Signed)
Routine Prenatal Care Visit  Subjective  Monique Reyes is a 25 y.o. G1P0000 at [redacted]w[redacted]d being seen today for ongoing prenatal care.  She is currently monitored for the following issues for this high-risk pregnancy and has Encounter for supervision of normal first pregnancy in second trimester; Obesity affecting pregnancy, antepartum; and Gestational diabetes on their problem list.  ----------------------------------------------------------------------------------- Patient reports no complaints.  She has not had diabetes ed yet, but is chekcing her sugars. Used to lotes of juices. Some elevated readings but she has also been checking at 1 hr postprandial. Contractions: Not present. Vag. Bleeding: None.  Movement: Present. Leaking Fluid denies.  ----------------------------------------------------------------------------------- The following portions of the patient's history were reviewed and updated as appropriate: allergies, current medications, past family history, past medical history, past social history, past surgical history and problem list. Problem list updated.  Objective  Blood pressure 124/70, height 5\' 7"  (1.702 m), weight 235 lb 6.4 oz (106.8 kg), last menstrual period 12/02/2020. Pregravid weight 208 lb (94.3 kg) Total Weight Gain 27 lb 6.4 oz (12.4 kg) Urinalysis: Urine Protein    Urine Glucose    Fetal Status:     Movement: Present     General:  Alert, oriented and cooperative. Patient is in no acute distress.  Skin: Skin is warm and dry. No rash noted.   Cardiovascular: Normal heart rate noted  Respiratory: Normal respiratory effort, no problems with respiration noted  Abdomen: Soft, gravid, appropriate for gestational age. Pain/Pressure: Absent     Pelvic:  Cervical exam deferred        Extremities: Normal range of motion.     Mental Status: Normal mood and affect. Normal behavior. Normal judgment and thought content.   Assessment   26 y.o. G1P0000 at [redacted]w[redacted]d by   08/30/2021, by Ultrasound presenting for routine prenatal visit  Plan   Pregnancy #1 Problems (from 01/19/21 to present)    Problem Noted Resolved   Gestational diabetes 07/02/2021 by 09/02/2021, CNM No   Overview Signed 07/02/2021  5:00 PM by 09/02/2021, CNM    Failed 1hr GTT and 3hr GTT. Referred by Mirna Mires to [facility].       Encounter for supervision of normal first pregnancy in second trimester 01/19/2021 by 01/21/2021, MD No   Overview Addendum 06/23/2021  5:05 PM by 06/25/2021, MD    [facility] Prenatal Labs  Dating By 9w u/s Blood type: A/Positive/-- (01/25 0935)   Genetic Screen  declined Antibody:Negative (01/25 0935)  Anatomic 05-27-1982 Normal female Addison Rubella: 1.07 (01/25 0935)  Varicella: non-immune  GTT Early:  112              Third trimester: 151  3hr:  RPR: Non Reactive (01/25 0935)   Rhogam Not needed  HBsAg: Negative (01/25 0935)   Vaccines TDAP:    06/23/2021                 Flu Shot: Covid:unvaccinated HIV: Non Reactive (01/25 0935)   Baby Food   Breast                             GBS:   GC/CT:  Contraception  unsure - possibly POP Pap: 2022  Childbirth Ed   resources provided, declined   CS/VBAC    Support Person  2023, husband           Obesity affecting pregnancy, antepartum 01/19/2021 by 01/21/2021,  MD No       Preterm labor symptoms and general obstetric precautions including but not limited to vaginal bleeding, contractions, leaking of fluid and fetal movement were reviewed in detail with the patient. Please refer to After Visit Summary for other counseling recommendations.   Return in about 2 weeks (around 07/16/2021) for return OB.  MD visit please.  Mirna Mires, CNM  07/02/2021 5:12 PM

## 2021-07-05 ENCOUNTER — Other Ambulatory Visit: Payer: Self-pay

## 2021-07-05 ENCOUNTER — Encounter: Payer: Managed Care, Other (non HMO) | Attending: Obstetrics & Gynecology | Admitting: *Deleted

## 2021-07-05 ENCOUNTER — Encounter: Payer: Self-pay | Admitting: *Deleted

## 2021-07-05 VITALS — BP 110/70 | Ht 67.0 in | Wt 236.5 lb

## 2021-07-05 DIAGNOSIS — O24419 Gestational diabetes mellitus in pregnancy, unspecified control: Secondary | ICD-10-CM | POA: Insufficient documentation

## 2021-07-05 DIAGNOSIS — O24410 Gestational diabetes mellitus in pregnancy, diet controlled: Secondary | ICD-10-CM

## 2021-07-05 NOTE — Patient Instructions (Signed)
Read booklet on Gestational Diabetes Follow Gestational Meal Planning Guidelines Continue avoiding sugar sweetened beverages Limit foods high in fat (biscuits, fast foods, mac-n-cheese) Complete a 3 Day Food Record and bring to next appointment Check blood sugars 4 x day - before breakfast and 2 hrs after every meal and record  Bring blood sugar log to all appointments Purchase urine ketone strips if instructed by MD and check urine ketones every am:  If + increase bedtime snack to 1 protein and 2 carbohydrate servings Walk 20-30 minutes at least 5 x week if permitted by MD

## 2021-07-05 NOTE — Progress Notes (Signed)
Diabetes Self-Management Education  Visit Type: First/Initial  Appt. Start Time: 1540 Appt. End Time: 4132  07/05/2021  [name], identified by name and date of birth, is a 25 y.o. female with a diagnosis of Diabetes: Gestational Diabetes.   ASSESSMENT  Blood pressure 110/70, height _0  (1.702 m), weight 236 lb 8 oz (107.3 kg), last menstrual period 12/02/2020, estimated date of delivery 08/30/2021 Body mass index is 37.04 kg/m.   Diabetes Self-Management Education - 07/05/21 1922       Visit Information   Visit Type First/Initial      Initial Visit   Diabetes Type Gestational Diabetes    Are you currently following a meal plan? Yes    What type of meal plan do you follow? "less sugar"    Are you taking your medications as prescribed? Yes    Date Diagnosed 1 week      Health Coping   How would you rate your overall health? Excellent      Psychosocial Assessment   Patient Belief/Attitude about Diabetes Other (comment)   "worried"   Self-care barriers None    Self-management support Doctor's office;Family    Other persons present Parent   Mother   Patient Concerns Nutrition/Meal planning;Weight Control;Glycemic Control;Healthy Lifestyle    Special Needs None    Preferred Learning Style Other (comment)   talking   Learning Readiness Change in progress    How often do you need to have someone help you when you read instructions, pamphlets, or other written materials from your doctor or pharmacy? 1 - Never    What is the last grade level you completed in school? college degree      Pre-Education Assessment   Patient understands the diabetes disease and treatment process. Needs Instruction    Patient understands incorporating nutritional management into lifestyle. Needs Instruction    Patient undertands incorporating physical activity into lifestyle. Needs Instruction    Patient understands using medications safely. Needs Instruction    Patient understands  monitoring blood glucose, interpreting and using results Needs Review    Patient understands prevention, detection, and treatment of acute complications. Needs Instruction    Patient understands prevention, detection, and treatment of chronic complications. Needs Instruction    Patient understands how to develop strategies to address psychosocial issues. Needs Instruction    Patient understands how to develop strategies to promote health/change behavior. Needs Instruction      Complications   How often do you check your blood sugar? 3-4 times/day    Fasting Blood glucose range (mg/dL) 70-129   FBG's 106-120 mg/dL - 6/6 elevated   Postprandial Blood glucose range (mg/dL) >200;180-200;130-179;70-129   pp breakfast 104-221 mg/dL 4/5 elevated; pp lunch 110-253 mg/dL 5/6 elevated; pp supper 127-168 4/4 elevated   Have you had a dilated eye exam in the past 12 months? No    Have you had a dental exam in the past 12 months? Yes    Are you checking your feet? 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, [place] toast stix, pancakes, syrup, cheese toast, grits, hashbrown, English muffin    Lunch eats at work (daycare0 mac-n-cheese, chicken, hamburger, spaghetti, chicken alfredo, tacos, corn, green beans, rice, fruits    Snack (afternoon) fruit (watermelon, strawberries, grapes, cantaloup), celery, chips, peanut butter crustables, crackers    Dinner chicken, beef, spaghetti, tacos, corn, squash and zucchini grilled, salads with lettuce, tomatoes, cuccumbers, cheese and dressing  Beverage(s) water, unsweet tea with Splenda, sweet tea when eating out      Exercise   Exercise Type ADL's      Patient Education   Previous Diabetes Education No    Disease state  Definition of diabetes, type 1 and 2, and the diagnosis of diabetes;Factors that contribute to the development of diabetes    Nutrition management  Role of diet in the treatment of diabetes and the relationship  between the three main macronutrients and blood glucose level;Food label reading, portion sizes and measuring food.;Reviewed blood glucose goals for pre and post meals and how to evaluate the patients' food intake on their blood glucose level.    Physical activity and exercise  Role of exercise on diabetes management, blood pressure control and cardiac health.    Medications Other (comment)   Limited use of oral medications during pregnancy and potential for insulin.   Monitoring Purpose and frequency of SMBG.;Taught/discussed recording of test results and interpretation of SMBG.;Ketone testing, when, how.    Chronic complications Relationship between chronic complications and blood glucose control    Psychosocial adjustment Identified and addressed patients feelings and concerns about diabetes    Preconception care Pregnancy and GDM  Role of pre-pregnancy blood glucose control on the development of the fetus;Reviewed with patient blood glucose goals with pregnancy;Role of family planning for patients with diabetes      Individualized Goals (developed by patient)   Reducing Risk Other (comment)   improve blood sugars, lose weight, lead a healthier lifestyle, become more fit     Outcomes   Expected Outcomes Demonstrated interest in learning. Expect positive outcomes    Future DMSE 2 wks             Individualized Plan for Diabetes Self-Management Training:   Learning Objective:  Patient will have a greater understanding of diabetes self-management. Patient education plan is to attend individual and/or group sessions per assessed needs and concerns.   Plan:   Patient Instructions  Read booklet on Gestational Diabetes Follow Gestational Meal Planning Guidelines Continue avoiding sugar sweetened beverages Limit foods high in fat (biscuits, fast foods, mac-n-cheese) Complete a 3 Day Food Record and bring to next appointment Check blood sugars 4 x day - before breakfast and 2 hrs after  every meal and record  Bring blood sugar log to all appointments Purchase urine ketone strips if instructed by MD and check urine ketones every am:  If + increase bedtime snack to 1 protein and 2 carbohydrate servings Walk 20-30 minutes at least 5 x week if permitted by MD  Expected Outcomes:  Demonstrated interest in learning. Expect positive outcomes  Education material provided:  Gestational Booklet Gestational Meal Planning Guidelines Simple Meal Plan 3 Day Food Record Goals for a Healthy Pregnancy   If problems or questions, patient to contact team via:  Johny Drilling, RN, [place] [phone]  Future DSME appointment: 2 wks July 22, 2021 with the dietitian

## 2021-07-12 ENCOUNTER — Telehealth: Payer: Self-pay

## 2021-07-12 NOTE — Telephone Encounter (Signed)
Pt calling; has questions regarding some pain in stomach; this am when she got up her stomach was really really tight; constantly; 5/10 on pain scale;  also was very very hot and was not able to cool herself down; doesn't know if B-H or what.  [phone]  Exp diff of true ctxs and B-H ctxs; adv we don't want her to have more than four an hour; adv to stay hydrated; being dehydrated can cause ctxs and inability to cool oneself; adv to wear good supportive tennis shoes instead of cute sandals; to watch salt intake as well.  Pt went in to work but is now home. Has good FM.

## 2021-07-15 ENCOUNTER — Ambulatory Visit
Admission: RE | Admit: 2021-07-15 | Discharge: 2021-07-15 | Disposition: A | Discharge status: Home / Self Care | Payer: Managed Care, Other (non HMO) | Source: Ambulatory Visit | Attending: Obstetrics and Gynecology | Admitting: Obstetrics and Gynecology

## 2021-07-15 ENCOUNTER — Other Ambulatory Visit: Payer: Self-pay

## 2021-07-15 DIAGNOSIS — Z3403 Encounter for supervision of normal first pregnancy, third trimester: Secondary | ICD-10-CM

## 2021-07-16 ENCOUNTER — Ambulatory Visit (INDEPENDENT_AMBULATORY_CARE_PROVIDER_SITE_OTHER): Payer: Managed Care, Other (non HMO) | Admitting: Obstetrics & Gynecology

## 2021-07-16 ENCOUNTER — Encounter: Payer: Self-pay | Admitting: Obstetrics & Gynecology

## 2021-07-16 VITALS — BP 120/80 | Wt 239.0 lb

## 2021-07-16 DIAGNOSIS — Z3A33 33 weeks gestation of pregnancy: Secondary | ICD-10-CM

## 2021-07-16 DIAGNOSIS — Z3403 Encounter for supervision of normal first pregnancy, third trimester: Secondary | ICD-10-CM

## 2021-07-16 DIAGNOSIS — O24410 Gestational diabetes mellitus in pregnancy, diet controlled: Secondary | ICD-10-CM

## 2021-07-16 LAB — POCT URINALYSIS DIPSTICK OB
Glucose, UA: NEGATIVE
POC,PROTEIN,UA: NEGATIVE

## 2021-07-16 MED ORDER — CLOTRIMAZOLE-BETAMETHASONE 1-0.05 % EX CREA: 1.0000 "application " | TOPICAL_CREAM | Freq: Two times a day (BID) | CUTANEOUS | 0 refills | Status: DC

## 2021-07-16 MED ORDER — METFORMIN HCL 500 MG PO TABS: 500.0000 mg | ORAL_TABLET | Freq: Every day | ORAL | 1 refills | Status: DC

## 2021-07-16 NOTE — Patient Instructions (Signed)

## 2021-07-16 NOTE — Progress Notes (Signed)
  Subjective  Fetal Movement? yes Contractions? occas BHs Leaking Fluid? no Vaginal Bleeding? no BS- FBS all abn, 2 hr PPBS mostly (>50%) normal  Objective  BP 120/80   Wt 239 lb (108.4 kg)   LMP 12/02/2020 (Approximate)   BMI 37.43 kg/m  General: NAD Pumonary: no increased work of breathing Abdomen: gravid, non-tender Extremities: no edema Psychiatric: mood appropriate, affect full  Assessment  25 y.o. G1P0000 at [redacted]w[redacted]d by  08/30/2021, by Ultrasound presenting for routine prenatal visit  Plan   Problem List Items Addressed This Visit      Endocrine   Gestational diabetes  Other Visit Diagnoses    Encounter for supervision of normal first pregnancy in third trimester    -  Primary   Relevant Orders   POC Urinalysis Dipstick OB (Completed)   [redacted] weeks gestation of pregnancy        Metformin 500 mg pm Recheck BS log one week [place] discussed, normal growth and AFI PNV, FMC Monitor for s/sx PTL  Pregnancy #1 Problems (from 01/19/21 to present)    Problem Noted Resolved   Gestational diabetes 07/02/2021 by Mirna Mires, CNM No   Overview Signed 07/02/2021  5:00 PM by Mirna Mires, CNM    Failed 1hr GTT and 3hr GTT. Referred by Tiburcio Pea to [facility].      Encounter for supervision of normal first pregnancy in second trimester 01/19/2021 by Conard Novak, MD No   Overview Addendum 07/16/2021  4:32 PM by Nadara Mustard, MD    [facility] Prenatal Labs  Dating By 9w u/s Blood type: A/Positive/-- (01/25 0935)   Genetic Screen  declined Antibody:Negative (01/25 0935)  Anatomic [place] Normal female Addison Rubella: 1.07 (01/25 0935)  Varicella: non-immune  GTT Early:  112              Third trimester: 151  3hr: all 4 abn RPR: Non Reactive (01/25 0935)   Rhogam Not needed  HBsAg: Negative (01/25 0935)   Vaccines TDAP:    06/23/2021                 Flu Shot:no Covid:unvaccinated HIV: Non Reactive (01/25 0935)   Baby Food   Breast                             GBS:    GC/CT:  Contraception  unsure - possibly POP Pap: 2022  Childbirth Ed   resources provided, declined   CS/VBAC    Support Person  Clifton Custard, husband          Obesity affecting pregnancy, antepartum 01/19/2021 by Conard Novak, MD No       Annamarie Major, MD, [facility], [facility] 07/16/2021  4:39 PM

## 2021-07-20 ENCOUNTER — Other Ambulatory Visit: Payer: Self-pay | Admitting: Obstetrics & Gynecology

## 2021-07-20 DIAGNOSIS — O24410 Gestational diabetes mellitus in pregnancy, diet controlled: Secondary | ICD-10-CM

## 2021-07-20 DIAGNOSIS — O24415 Gestational diabetes mellitus in pregnancy, controlled by oral hypoglycemic drugs: Secondary | ICD-10-CM

## 2021-07-21 ENCOUNTER — Telehealth: Payer: Self-pay

## 2021-07-21 ENCOUNTER — Other Ambulatory Visit: Payer: Self-pay | Admitting: Obstetrics & Gynecology

## 2021-07-21 MED ORDER — GLUCOSE BLOOD VI STRP: ORAL_STRIP | 12 refills | Status: DC

## 2021-07-21 NOTE — Telephone Encounter (Signed)
Done

## 2021-07-21 NOTE — Telephone Encounter (Signed)
Pt calling; needs test strips; does she need rx or are they otc?  [phone]  Pt states she just needs the test strips for the One Touch Ultra 2 device; states was told to use lancets until they were dull so has plenty of them.  [facility] [place]

## 2021-07-22 ENCOUNTER — Encounter: Payer: Self-pay | Admitting: Dietician

## 2021-07-22 ENCOUNTER — Encounter: Payer: Managed Care, Other (non HMO) | Admitting: Dietician

## 2021-07-22 ENCOUNTER — Other Ambulatory Visit: Payer: Self-pay

## 2021-07-22 VITALS — BP 118/70 | Ht 67.0 in | Wt 238.3 lb

## 2021-07-22 DIAGNOSIS — O24410 Gestational diabetes mellitus in pregnancy, diet controlled: Secondary | ICD-10-CM

## 2021-07-22 DIAGNOSIS — O24419 Gestational diabetes mellitus in pregnancy, unspecified control: Secondary | ICD-10-CM | POA: Diagnosis not present

## 2021-07-22 NOTE — Telephone Encounter (Signed)
Pt states she has her supplies.

## 2021-07-22 NOTE — Progress Notes (Signed)
Patient's BG record indicates fasting BGs ranging 97-100 since starting Metformin, and post-meal BGs ranging 73-170 since starting Metformin. Patient reports once episode of feeling shaky on one side of her body, during the night.  Advised her to test BG if she experiences low BG symptoms. Reviewed low BG symptoms and appropriate treatment.  Patient's food diary indicates she is eating 3 meals and 1 snack daily. She reports hunger symptoms within 2 hours after meals but unable to eat morning snack due to work responsibilities. She has avoided evening snack due to elevated fasting BGs. Some meals are higher than goal for carbs, including some restaurant meals when she often drinks regular soda.   Provided basic balanced meal plan, and wrote individualized menus based on patient's food preferences. Reviewed goals for carb intake and effects of consuming sugar sweetened beverages.  Discussed timing of meal and snacks and healthy snack options. Instructed patient on food safety, including avoidance of Listeriosis, and limiting mercury from fish. Discussed importance of maintaining healthy lifestyle habits to reduce risk of Type 2 DM as well as Gestational DM with any future pregnancies. Advised patient to use any remaining testing supplies to test some BGs after delivery, and to have BG tested ideally annually, as well as prior to attempting future pregnancies.

## 2021-07-22 NOTE — Patient Instructions (Signed)
Carefully control carb intake with supper meals; drink unsweetened tea when eating out rather than sprite (unless zero sugar sprite). If carb intake is more than 3 servings (45g) with a meal, walk or stay active for at least 10-15 minutes after eating to prevent high blood sugar.  If blood sugar is below 70, (check sugar if you feel shaky/ weak) take 4oz of juice or regular soda, allow 10-15 minutes for symptoms to improve, and if so, follow with a meal or snack. If you still have symptoms or BG is still under 70, repeat 4oz juice and then a meal or snack.  Try having a small bedtime snack with protein and small amount of carb if dinner is several hours before going to sleep and monitor effects of fasting sugar the next day. It might take several days of this before noticing a pattern.

## 2021-07-27 ENCOUNTER — Other Ambulatory Visit: Payer: Self-pay

## 2021-07-27 ENCOUNTER — Ambulatory Visit (INDEPENDENT_AMBULATORY_CARE_PROVIDER_SITE_OTHER): Payer: Managed Care, Other (non HMO) | Admitting: Obstetrics

## 2021-07-27 VITALS — BP 124/80 | Wt 240.0 lb

## 2021-07-27 DIAGNOSIS — Z3480 Encounter for supervision of other normal pregnancy, unspecified trimester: Secondary | ICD-10-CM

## 2021-07-27 DIAGNOSIS — Z3A35 35 weeks gestation of pregnancy: Secondary | ICD-10-CM

## 2021-07-27 NOTE — Progress Notes (Signed)
Routine Prenatal Care Visit  Subjective  Monique Reyes is a 25 y.o. G1P0000 at [redacted]w[redacted]d being seen today for ongoing prenatal care.  She is currently monitored for the following issues for this high-risk pregnancy and has Encounter for supervision of normal first pregnancy in second trimester; Obesity affecting pregnancy, antepartum; and Gestational diabetes on their problem list.  ----------------------------------------------------------------------------------- Patient reports no complaints.  She thinks that the baby has flipped to vertex from breech. She has another ultrasound next week for fetal growth. Her fasting levels have been slightly elelvated this past week. She is now on glucophage daily.  .  .   Pincus Large Fluid denies.  ----------------------------------------------------------------------------------- The following portions of the patient's history were reviewed and updated as appropriate: allergies, current medications, past family history, past medical history, past social history, past surgical history and problem list. Problem list updated.  Objective  Blood pressure 124/80, weight 240 lb (108.9 kg), last menstrual period 12/02/2020. Pregravid weight 208 lb (94.3 kg) Total Weight Gain 32 lb (14.5 kg) Urinalysis: Urine Protein    Urine Glucose    Fetal Status:           General:  Alert, oriented and cooperative. Patient is in no acute distress.  Skin: Skin is warm and dry. No rash noted.   Cardiovascular: Normal heart rate noted  Respiratory: Normal respiratory effort, no problems with respiration noted  Abdomen: Soft, gravid, appropriate for gestational age.       Pelvic:  Cervical exam deferred        Extremities: Normal range of motion.     Mental Status: Normal mood and affect. Normal behavior. Normal judgment and thought content.   Assessment   25 y.o. G1P0000 at [redacted]w[redacted]d by  08/30/2021, by Ultrasound presenting for routine prenatal visit  Plan   Pregnancy #1  Problems (from 01/19/21 to present)    Problem Noted Resolved   Gestational diabetes 07/02/2021 by Mirna Mires, CNM No   Overview Signed 07/02/2021  5:00 PM by Mirna Mires, CNM    Failed 1hr GTT and 3hr GTT. Referred by Tiburcio Pea to [facility].      Encounter for supervision of normal first pregnancy in second trimester 01/19/2021 by Conard Novak, MD No   Overview Addendum 07/16/2021  4:32 PM by Nadara Mustard, MD    [facility] Prenatal Labs  Dating By 9w u/s Blood type: A/Positive/-- (01/25 0935)   Genetic Screen  declined Antibody:Negative (01/25 0935)  Anatomic [place] Normal female Addison Rubella: 1.07 (01/25 0935)  Varicella: non-immune  GTT Early:  112              Third trimester: 151  3hr: all 4 abn RPR: Non Reactive (01/25 0935)   Rhogam Not needed  HBsAg: Negative (01/25 0935)   Vaccines TDAP:    06/23/2021                 Flu Shot:no Covid:unvaccinated HIV: Non Reactive (01/25 0935)   Baby Food   Breast                             GBS:   GC/CT:  Contraception  unsure - possibly POP Pap: 2022  Childbirth Ed   resources provided, declined   CS/VBAC    Support Person  Clifton Custard, husband          Obesity affecting pregnancy, antepartum 01/19/2021 by Conard Novak, MD No  Preterm labor symptoms and general obstetric precautions including but not limited to vaginal bleeding, contractions, leaking of fluid and fetal movement were reviewed in detail with the patient. Please refer to After Visit Summary for other counseling recommendations.  Reviewed dietary guidelines to keep her glucose levels better. She is seeing MFM next week and they will also review her levels.  Return in about 1 week (around 08/03/2021) for return OB.  Mirna Mires, CNM  07/27/2021 5:28 PM

## 2021-07-27 NOTE — Progress Notes (Signed)
ROB - no concerns. RM 5 

## 2021-08-02 ENCOUNTER — Other Ambulatory Visit: Payer: Self-pay

## 2021-08-02 ENCOUNTER — Ambulatory Visit (INDEPENDENT_AMBULATORY_CARE_PROVIDER_SITE_OTHER): Payer: Managed Care, Other (non HMO) | Admitting: Obstetrics

## 2021-08-02 VITALS — BP 100/60 | Wt 241.0 lb

## 2021-08-02 DIAGNOSIS — Z3480 Encounter for supervision of other normal pregnancy, unspecified trimester: Secondary | ICD-10-CM

## 2021-08-02 DIAGNOSIS — O24410 Gestational diabetes mellitus in pregnancy, diet controlled: Secondary | ICD-10-CM

## 2021-08-02 DIAGNOSIS — Z3A36 36 weeks gestation of pregnancy: Secondary | ICD-10-CM

## 2021-08-02 NOTE — Progress Notes (Signed)
Routine Prenatal Care Visit  Subjective  Monique Reyes is a 25 y.o. G1P0000 at [redacted]w[redacted]d being seen today for ongoing prenatal care.  She is currently monitored for the following issues for this high-risk pregnancy and has Encounter for supervision of normal first pregnancy in second trimester; Obesity affecting pregnancy, antepartum; and Gestational diabetes on their problem list.  ----------------------------------------------------------------------------------- Patient reports no complaints.    .  .   Pincus Large Fluid denies.  ----------------------------------------------------------------------------------- The following portions of the patient's history were reviewed and updated as appropriate: allergies, current medications, past family history, past medical history, past social history, past surgical history and problem list. Problem list updated.  Objective  Blood pressure 100/60, weight 241 lb (109.3 kg), last menstrual period 12/02/2020. Pregravid weight 208 lb (94.3 kg) Total Weight Gain 33 lb (15 kg) Urinalysis: Urine Protein    Urine Glucose    Fetal Status:           General:  Alert, oriented and cooperative. Patient is in no acute distress.  Skin: Skin is warm and dry. No rash noted.   Cardiovascular: Normal heart rate noted  Respiratory: Normal respiratory effort, no problems with respiration noted  Abdomen: Soft, gravid, appropriate for gestational age.       Pelvic:  Cervical exam performed      3cms/70%/-3  Extremities: Normal range of motion.     Mental Status: Normal mood and affect. Normal behavior. Normal judgment and thought content.   Assessment   25 y.o. G1P0000 at [redacted]w[redacted]d by  08/30/2021, by Ultrasound presenting for routine prenatal visit  Plan   Pregnancy #1 Problems (from 01/19/21 to present)    Problem Noted Resolved   Gestational diabetes 07/02/2021 by Mirna Mires, CNM No   Overview Signed 07/02/2021  5:00 PM by Mirna Mires, CNM    Failed 1hr  GTT and 3hr GTT. Referred by Tiburcio Pea to [facility].      Encounter for supervision of normal first pregnancy in second trimester 01/19/2021 by Conard Novak, MD No   Overview Addendum 07/16/2021  4:32 PM by Nadara Mustard, MD    [facility] Prenatal Labs  Dating By 9w u/s Blood type: A/Positive/-- (01/25 0935)   Genetic Screen  declined Antibody:Negative (01/25 0935)  Anatomic [place] Normal female Addison Rubella: 1.07 (01/25 0935)  Varicella: non-immune  GTT Early:  112              Third trimester: 151  3hr: all 4 abn RPR: Non Reactive (01/25 0935)   Rhogam Not needed  HBsAg: Negative (01/25 0935)   Vaccines TDAP:    06/23/2021                 Flu Shot:no Covid:unvaccinated HIV: Non Reactive (01/25 0935)   Baby Food   Breast                             GBS:   GC/CT:  Contraception  unsure - possibly POP Pap: 2022  Childbirth Ed   resources provided, declined   CS/VBAC    Support Person  Clifton Custard, husband          Obesity affecting pregnancy, antepartum 01/19/2021 by Conard Novak, MD No       Term labor symptoms and general obstetric precautions including but not limited to vaginal bleeding, contractions, leaking of fluid and fetal movement were reviewed in detail with the patient. Please refer to After  Visit Summary for other counseling recommendations.  GBS retrieved today. She has a visit with MFM tomorrow, Her glucose levels are ok, lasrgely a tad higher for her daily fasting levels. AS she is already well dilated, she would be inducible at 37 weeks if MFM recommends.  Return in about 1 week (around 08/09/2021) for return OB with MD please.  Mirna Mires, CNM  08/02/2021 4:44 PM

## 2021-08-02 NOTE — Progress Notes (Signed)
ROB- no concerns 

## 2021-08-03 ENCOUNTER — Other Ambulatory Visit: Payer: Self-pay | Admitting: Obstetrics & Gynecology

## 2021-08-03 ENCOUNTER — Ambulatory Visit: Payer: Managed Care, Other (non HMO) | Admitting: *Deleted

## 2021-08-03 ENCOUNTER — Encounter: Payer: Self-pay | Admitting: *Deleted

## 2021-08-03 ENCOUNTER — Ambulatory Visit: Payer: Managed Care, Other (non HMO) | Attending: Obstetrics & Gynecology

## 2021-08-03 VITALS — BP 125/61 | HR 74

## 2021-08-03 DIAGNOSIS — O24415 Gestational diabetes mellitus in pregnancy, controlled by oral hypoglycemic drugs: Secondary | ICD-10-CM

## 2021-08-03 DIAGNOSIS — O99213 Obesity complicating pregnancy, third trimester: Secondary | ICD-10-CM | POA: Diagnosis not present

## 2021-08-03 DIAGNOSIS — Z3A36 36 weeks gestation of pregnancy: Secondary | ICD-10-CM | POA: Diagnosis present

## 2021-08-03 DIAGNOSIS — Z3402 Encounter for supervision of normal first pregnancy, second trimester: Secondary | ICD-10-CM

## 2021-08-03 DIAGNOSIS — O99210 Obesity complicating pregnancy, unspecified trimester: Secondary | ICD-10-CM

## 2021-08-05 ENCOUNTER — Encounter: Payer: Self-pay | Admitting: Obstetrics and Gynecology

## 2021-08-05 ENCOUNTER — Other Ambulatory Visit: Payer: Self-pay

## 2021-08-05 ENCOUNTER — Observation Stay
Admission: EM | Admit: 2021-08-05 | Discharge: 2021-08-05 | Disposition: A | Discharge status: Home / Self Care | Payer: Managed Care, Other (non HMO) | Attending: Obstetrics and Gynecology | Admitting: Obstetrics and Gynecology

## 2021-08-05 DIAGNOSIS — Z3A36 36 weeks gestation of pregnancy: Secondary | ICD-10-CM | POA: Diagnosis not present

## 2021-08-05 DIAGNOSIS — O24410 Gestational diabetes mellitus in pregnancy, diet controlled: Secondary | ICD-10-CM

## 2021-08-05 DIAGNOSIS — O99210 Obesity complicating pregnancy, unspecified trimester: Secondary | ICD-10-CM

## 2021-08-05 DIAGNOSIS — Z3402 Encounter for supervision of normal first pregnancy, second trimester: Secondary | ICD-10-CM

## 2021-08-05 LAB — CULTURE, BETA STREP (GROUP B ONLY): Strep Gp B Culture: POSITIVE — AB

## 2021-08-05 NOTE — OB Triage Note (Signed)
G1P0 started having cxt around 7:30p 5-44min apart.

## 2021-08-05 NOTE — Discharge Summary (Signed)
Physician Final Progress Note  Patient ID: Monique Reyes MRN: [id number] DOB/AGE: [birth year] 25 y.o.  Admit date: 08/05/2021 Admitting provider: Malachy Mood, MD Discharge date: 08/05/2021   Admission Diagnoses: Contractions  Discharge Diagnoses:  Active Problems:   Labor and delivery indication for care or intervention   25 y.o. G1P0000 at 34w3dby Estimated Date of Delivery: 08/30/21 presenting to labor and delivery with contractions.  Found to be contracting irregular q6-193m.  Cervix unchanged from clinic exam 3 days ago.  She was seen by L&D with delivery recommended between 3770w0dd 38w82w0dFM, no LOF, no VB.  Patient was set up for IOL to 08/12/2021.  Pregnancy #1 Problems (from 01/19/21 to 08/14/21)     Problem Noted Resolved   Gestational diabetes 07/02/2021 by FryeImagene RichesM No   Overview Signed 07/02/2021  5:00 PM by FryeImagene RichesM    Failed 1hr GTT and 3hr GTT. [facility]      Encounter for supervision of normal first pregnancy in second trimester 01/19/2021 by JackWill Bonnet No   Overview Addendum 08/05/2021  7:16 AM by FryeImagene RichesM [facility]  Dating By 9w u/s Blood type: A/Positive/-- (01/25 0935)   Genetic Screen  declined Antibody:Negative (01/25 0935)  Anatomic US NKoreamal female Addison Rubella: 1.07 (01/25 0935)  Varicella: non-immune  GTT Early:  112              Third trimester: 151  3hr: all 4 abn RPR: Non Reactive (01/25 0935)   Rhogam Not needed  HBsAg: Negative (01/25 0935)   Vaccines TDAP:    06/23/2021                 Flu Shot:no Covid:unvaccinated HIV: Non Reactive (01/25 0935)   Baby Food   Breast                             GBS:  positive GC/CT:  Contraception  unsure - possibly POP Pap: 2022  Childbirth Ed   resources provided, declined   CS/VBAC    Support Person  AaroMarjory Liessband          Obesity affecting pregnancy, antepartum 01/19/2021 by JackWill BonnetD No        Consults: None  Significant Findings/ Diagnostic Studies: none   Procedures: NST  Discharge Condition: good  Disposition: Discharge disposition: 01-Home or Self Care       Diet: Regular diet  Discharge Activity: Activity as tolerated  Discharge Instructions     Discharge activity:  No Restrictions   Complete by: As directed    Discharge diet:  No restrictions   Complete by: As directed    Fetal Kick Count:  Lie on our left side for one hour after a meal, and count the number of times your baby kicks.  If it is less than 5 times, get up, move around and drink some juice.  Repeat the test 30 minutes later.  If it is still less than 5 kicks in an hour, notify your doctor.   Complete by: As directed    LABOR:  When conractions begin, you should start to time them from the beginning of one contraction to the beginning  of the next.  When contractions are 5 - 10 minutes apart or less and have been regular for at least an hour, you should call  your health care provider.   Complete by: As directed    No sexual activity restrictions   Complete by: As directed    Notify physician for bleeding from the vagina   Complete by: As directed    Notify physician for blurring of vision or spots before the eyes   Complete by: As directed    Notify physician for chills or fever   Complete by: As directed    Notify physician for fainting spells, "black outs" or loss of consciousness   Complete by: As directed    Notify physician for increase in vaginal discharge   Complete by: As directed    Notify physician for leaking of fluid   Complete by: As directed    Notify physician for pain or burning when urinating   Complete by: As directed    Notify physician for pelvic pressure (sudden increase)   Complete by: As directed    Notify physician for severe or continued nausea or vomiting   Complete by: As directed    Notify physician for sudden gushing of fluid from the vagina  (with or without continued leaking)   Complete by: As directed    Notify physician for sudden, constant, or occasional abdominal pain   Complete by: As directed    Notify physician if baby moving less than usual   Complete by: As directed       Allergies as of 08/05/2021   Not on File      Medication List     STOP taking these medications    clotrimazole-betamethasone cream Commonly known as: Lotrisone       TAKE these medications    glucose blood test strip Use as instructed   metFORMIN 500 MG tablet Commonly known as: GLUCOPHAGE Take 1 tablet (500 mg total) by mouth daily at 6 PM.   multivitamin-prenatal 27-0.8 MG Tabs tablet Take 1 tablet by mouth daily at 12 noon.   ONE TOUCH ULTRA 2 w/Device Kit 4 (four) times daily.   VITAMIN C PO Take 1 tablet by mouth daily.         Total time spent taking care of this patient: 30 minutes  Signed: Malachy Mood 08/05/2021, 10:49 PM

## 2021-08-06 ENCOUNTER — Ambulatory Visit (INDEPENDENT_AMBULATORY_CARE_PROVIDER_SITE_OTHER): Payer: Managed Care, Other (non HMO) | Admitting: Obstetrics and Gynecology

## 2021-08-06 VITALS — BP 132/78 | Wt 242.0 lb

## 2021-08-06 DIAGNOSIS — Z3A36 36 weeks gestation of pregnancy: Secondary | ICD-10-CM

## 2021-08-06 DIAGNOSIS — O24410 Gestational diabetes mellitus in pregnancy, diet controlled: Secondary | ICD-10-CM

## 2021-08-06 DIAGNOSIS — Z3402 Encounter for supervision of normal first pregnancy, second trimester: Secondary | ICD-10-CM

## 2021-08-06 DIAGNOSIS — O99210 Obesity complicating pregnancy, unspecified trimester: Secondary | ICD-10-CM

## 2021-08-06 NOTE — Progress Notes (Signed)
BP check - RM 5

## 2021-08-06 NOTE — Progress Notes (Signed)
Routine Prenatal Care Visit  Subjective  Monique Reyes is a 25 y.o. G1P0000 at [redacted]w[redacted]d being seen today for ongoing prenatal care.  She is currently monitored for the following issues for this low-risk pregnancy and has Encounter for supervision of normal first pregnancy in second trimester; Obesity affecting pregnancy, antepartum; Gestational diabetes; and Labor and delivery indication for care or intervention on their problem list.  ----------------------------------------------------------------------------------- Patient reports no complaints.   Contractions: Not present. Vag. Bleeding: None.  Movement: Present. Denies leaking of fluid.  ----------------------------------------------------------------------------------- The following portions of the patient's history were reviewed and updated as appropriate: allergies, current medications, past family history, past medical history, past social history, past surgical history and problem list. Problem list updated.   Objective  Blood pressure 132/78, weight 242 lb (109.8 kg), last menstrual period 12/02/2020. Pregravid weight 208 lb (94.3 kg) Total Weight Gain 34 lb (15.4 kg) Urinalysis:      Fetal Status: Fetal Heart Rate (bpm): 140   Movement: Present  Presentation: Vertex  General:  Alert, oriented and cooperative. Patient is in no acute distress.  Skin: Skin is warm and dry. No rash noted.   Cardiovascular: Normal heart rate noted  Respiratory: Normal respiratory effort, no problems with respiration noted  Abdomen: Soft, gravid, appropriate for gestational age. Pain/Pressure: Absent     Pelvic:  Cervical exam deferred        Extremities: Normal range of motion.     ental Status: Normal mood and affect. Normal behavior. Normal judgment and thought content.     Assessment   25 y.o. G1P0000 at [redacted]w[redacted]d by  08/30/2021, by Ultrasound presenting for routine prenatal visit  Plan   Pregnancy #1 Problems (from 01/19/21 to present)      Problem Noted Resolved   Gestational diabetes 07/02/2021 by Mirna Mires, CNM No   Overview Signed 07/02/2021  5:00 PM by Mirna Mires, CNM    Failed 1hr GTT and 3hr GTT. Referred by Tiburcio Pea to [facility].      Encounter for supervision of normal first pregnancy in second trimester 01/19/2021 by Conard Novak, MD No   Overview Addendum 08/05/2021  7:16 AM by Mirna Mires, CNM    Clinic [facility] Prenatal Labs  Dating By 9w u/s Blood type: A/Positive/-- (01/25 0935)   Genetic Screen  declined Antibody:Negative (01/25 0935)  Anatomic [place] Normal female Addison Rubella: 1.07 (01/25 0935)  Varicella: non-immune  GTT Early:  112              Third trimester: 151  3hr: all 4 abn RPR: Non Reactive (01/25 0935)   Rhogam Not needed  HBsAg: Negative (01/25 0935)   Vaccines TDAP:    06/23/2021                 Flu Shot:no Covid:unvaccinated HIV: Non Reactive (01/25 0935)   Baby Food   Breast                             GBS:  positive GC/CT:  Contraception  unsure - possibly POP Pap: 2022  Childbirth Ed   resources provided, declined   CS/VBAC    Support Person  Clifton Custard, husband          Obesity affecting pregnancy, antepartum 01/19/2021 by Conard Novak, MD No        Gestational age appropriate obstetric precautions including but not limited to vaginal bleeding, contractions, leaking of fluid  and fetal movement were reviewed in detail with the patient.    BP normotensive today was mild range in triage yesterday  Return Induction scheduled for 8/18.  Vena Austria, MD, [facility], [facility] 08/06/2021, 11:27 AM

## 2021-08-10 ENCOUNTER — Other Ambulatory Visit: Payer: Self-pay

## 2021-08-10 ENCOUNTER — Other Ambulatory Visit
Admission: RE | Admit: 2021-08-10 | Discharge: 2021-08-10 | Disposition: A | Discharge status: Home / Self Care | Payer: Managed Care, Other (non HMO) | Source: Ambulatory Visit | Attending: Obstetrics and Gynecology | Admitting: Obstetrics and Gynecology

## 2021-08-10 ENCOUNTER — Encounter: Payer: Managed Care, Other (non HMO) | Admitting: Obstetrics & Gynecology

## 2021-08-10 DIAGNOSIS — Z20822 Contact with and (suspected) exposure to covid-19: Secondary | ICD-10-CM | POA: Insufficient documentation

## 2021-08-10 DIAGNOSIS — Z01812 Encounter for preprocedural laboratory examination: Secondary | ICD-10-CM | POA: Insufficient documentation

## 2021-08-10 LAB — SARS CORONAVIRUS 2 (TAT 6-24 HRS): SARS Coronavirus 2: NEGATIVE

## 2021-08-12 ENCOUNTER — Encounter: Payer: Self-pay | Admitting: Obstetrics and Gynecology

## 2021-08-12 ENCOUNTER — Inpatient Hospital Stay: Payer: Managed Care, Other (non HMO) | Admitting: Anesthesiology

## 2021-08-12 ENCOUNTER — Other Ambulatory Visit: Payer: Self-pay

## 2021-08-12 ENCOUNTER — Inpatient Hospital Stay
Admission: EM | Admit: 2021-08-12 | Discharge: 2021-08-14 | DRG: 806 | Disposition: A | Discharge status: Home / Self Care | Payer: Managed Care, Other (non HMO) | Attending: Obstetrics & Gynecology | Admitting: Obstetrics & Gynecology

## 2021-08-12 DIAGNOSIS — O24419 Gestational diabetes mellitus in pregnancy, unspecified control: Secondary | ICD-10-CM | POA: Diagnosis present

## 2021-08-12 DIAGNOSIS — O9081 Anemia of the puerperium: Secondary | ICD-10-CM | POA: Diagnosis not present

## 2021-08-12 DIAGNOSIS — O24425 Gestational diabetes mellitus in childbirth, controlled by oral hypoglycemic drugs: Secondary | ICD-10-CM | POA: Diagnosis present

## 2021-08-12 DIAGNOSIS — O99210 Obesity complicating pregnancy, unspecified trimester: Secondary | ICD-10-CM | POA: Diagnosis present

## 2021-08-12 DIAGNOSIS — O99214 Obesity complicating childbirth: Secondary | ICD-10-CM | POA: Diagnosis present

## 2021-08-12 DIAGNOSIS — O24429 Gestational diabetes mellitus in childbirth, unspecified control: Secondary | ICD-10-CM

## 2021-08-12 DIAGNOSIS — O99824 Streptococcus B carrier state complicating childbirth: Secondary | ICD-10-CM | POA: Diagnosis present

## 2021-08-12 DIAGNOSIS — Z3A37 37 weeks gestation of pregnancy: Secondary | ICD-10-CM

## 2021-08-12 DIAGNOSIS — O24410 Gestational diabetes mellitus in pregnancy, diet controlled: Secondary | ICD-10-CM

## 2021-08-12 DIAGNOSIS — Z20822 Contact with and (suspected) exposure to covid-19: Secondary | ICD-10-CM | POA: Diagnosis present

## 2021-08-12 DIAGNOSIS — D62 Acute posthemorrhagic anemia: Secondary | ICD-10-CM | POA: Diagnosis not present

## 2021-08-12 DIAGNOSIS — Z3402 Encounter for supervision of normal first pregnancy, second trimester: Secondary | ICD-10-CM

## 2021-08-12 LAB — GLUCOSE, CAPILLARY
Glucose-Capillary: 153 mg/dL — ABNORMAL HIGH (ref 70–99)
Glucose-Capillary: 121 mg/dL — ABNORMAL HIGH (ref 70–99)
Glucose-Capillary: 104 mg/dL — ABNORMAL HIGH (ref 70–99)
Glucose-Capillary: 76 mg/dL (ref 70–99)

## 2021-08-12 LAB — ABO/RH: ABO/RH(D): A POS

## 2021-08-12 LAB — TYPE AND SCREEN
ABO/RH(D): A POS
Antibody Screen: NEGATIVE

## 2021-08-12 LAB — CBC
HCT: 31.9 % — ABNORMAL LOW (ref 36.0–46.0)
Hemoglobin: 10.4 g/dL — ABNORMAL LOW (ref 12.0–15.0)
MCH: 28.3 pg (ref 26.0–34.0)
MCHC: 32.6 g/dL (ref 30.0–36.0)
MCV: 86.7 fL (ref 80.0–100.0)
Platelets: 395 10*3/uL (ref 150–400)
RBC: 3.68 MIL/uL — ABNORMAL LOW (ref 3.87–5.11)
RDW: 14.2 % (ref 11.5–15.5)
WBC: 9.4 10*3/uL (ref 4.0–10.5)
nRBC: 0 % (ref 0.0–0.2)

## 2021-08-12 MED ORDER — SODIUM CHLORIDE 0.9 % IV SOLN
INTRAVENOUS | Status: DC | PRN
  Administered 2021-08-12: 3 mL via EPIDURAL
  Administered 2021-08-12 (×2): 4 mL via EPIDURAL

## 2021-08-12 MED ORDER — PHENYLEPHRINE 40 MCG/ML (10ML) SYRINGE FOR IV PUSH (FOR BLOOD PRESSURE SUPPORT)
80.0000 ug | PREFILLED_SYRINGE | INTRAVENOUS | Status: DC | PRN
  Filled 2021-08-12: qty 10

## 2021-08-12 MED ORDER — EPHEDRINE 5 MG/ML INJ
10.0000 mg | INTRAVENOUS | Status: DC | PRN
  Filled 2021-08-12: qty 2

## 2021-08-12 MED ORDER — METHYLERGONOVINE MALEATE 0.2 MG/ML IJ SOLN
0.2000 mg | Freq: Once | INTRAMUSCULAR | Status: AC
  Administered 2021-08-12: 0.2 mg via INTRAMUSCULAR

## 2021-08-12 MED ORDER — METHYLERGONOVINE MALEATE 0.2 MG/ML IJ SOLN
INTRAMUSCULAR | Status: AC
  Filled 2021-08-12: qty 1

## 2021-08-12 MED ORDER — PENICILLIN G POTASSIUM 5000000 UNITS IJ SOLR: 5.0000 10*6.[IU] | Freq: Once | INTRAMUSCULAR | Status: DC

## 2021-08-12 MED ORDER — PENICILLIN G POT IN DEXTROSE 60000 UNIT/ML IV SOLN
3.0000 10*6.[IU] | INTRAVENOUS | Status: DC
  Administered 2021-08-12 (×2): 3 10*6.[IU] via INTRAVENOUS
  Filled 2021-08-12 (×9): qty 50

## 2021-08-12 MED ORDER — AMMONIA AROMATIC IN INHA
RESPIRATORY_TRACT | Status: AC
  Filled 2021-08-12: qty 10

## 2021-08-12 MED ORDER — OXYTOCIN 10 UNIT/ML IJ SOLN
INTRAMUSCULAR | Status: AC
  Filled 2021-08-12: qty 2

## 2021-08-12 MED ORDER — PHENYLEPHRINE 40 MCG/ML (10ML) SYRINGE FOR IV PUSH (FOR BLOOD PRESSURE SUPPORT)
80.0000 ug | PREFILLED_SYRINGE | INTRAVENOUS | Status: DC | PRN
Start: 2021-08-12 — End: 2021-08-13
  Filled 2021-08-12: qty 10

## 2021-08-12 MED ORDER — MISOPROSTOL 25 MCG QUARTER TABLET
25.0000 ug | ORAL_TABLET | ORAL | Status: DC | PRN
Start: 2021-08-12 — End: 2021-08-13
  Filled 2021-08-12 (×2): qty 1

## 2021-08-12 MED ORDER — ONDANSETRON HCL 4 MG/2ML IJ SOLN
4.0000 mg | Freq: Four times a day (QID) | INTRAMUSCULAR | Status: DC | PRN
  Administered 2021-08-12: 4 mg via INTRAVENOUS
  Filled 2021-08-12: qty 2

## 2021-08-12 MED ORDER — DIBUCAINE (PERIANAL) 1 % EX OINT
1.0000 "application " | TOPICAL_OINTMENT | CUTANEOUS | Status: DC | PRN
  Filled 2021-08-12: qty 28

## 2021-08-12 MED ORDER — BENZOCAINE-MENTHOL 20-0.5 % EX AERO
INHALATION_SPRAY | CUTANEOUS | Status: AC
  Filled 2021-08-12: qty 56

## 2021-08-12 MED ORDER — FENTANYL-BUPIVACAINE-NACL 0.5-0.125-0.9 MG/250ML-% EP SOLN
12.0000 mL/h | EPIDURAL | Status: DC | PRN
  Administered 2021-08-12: 12 mL/h via EPIDURAL

## 2021-08-12 MED ORDER — ACETAMINOPHEN 325 MG PO TABS: 650.0000 mg | ORAL_TABLET | ORAL | Status: DC | PRN

## 2021-08-12 MED ORDER — LACTATED RINGERS IV SOLN: 500.0000 mL | Freq: Once | INTRAVENOUS | Status: DC

## 2021-08-12 MED ORDER — WITCH HAZEL-GLYCERIN EX PADS: 1.0000 "application " | MEDICATED_PAD | CUTANEOUS | Status: DC | PRN

## 2021-08-12 MED ORDER — LIDOCAINE HCL (PF) 1 % IJ SOLN
INTRAMUSCULAR | Status: DC | PRN
  Administered 2021-08-12: 3 mL

## 2021-08-12 MED ORDER — IBUPROFEN 600 MG PO TABS
ORAL_TABLET | ORAL | Status: AC
  Administered 2021-08-12: 600 mg
  Filled 2021-08-12: qty 1

## 2021-08-12 MED ORDER — WITCH HAZEL-GLYCERIN EX PADS
MEDICATED_PAD | CUTANEOUS | Status: AC
  Filled 2021-08-12: qty 100

## 2021-08-12 MED ORDER — OXYTOCIN-SODIUM CHLORIDE 30-0.9 UT/500ML-% IV SOLN
1.0000 m[IU]/min | INTRAVENOUS | Status: DC
  Administered 2021-08-12: 2 m[IU]/min via INTRAVENOUS
  Filled 2021-08-12: qty 500

## 2021-08-12 MED ORDER — OXYCODONE-ACETAMINOPHEN 5-325 MG PO TABS: 1.0000 | ORAL_TABLET | ORAL | Status: DC | PRN

## 2021-08-12 MED ORDER — OXYTOCIN-SODIUM CHLORIDE 30-0.9 UT/500ML-% IV SOLN
2.5000 [IU]/h | INTRAVENOUS | Status: DC
  Filled 2021-08-12: qty 500

## 2021-08-12 MED ORDER — TERBUTALINE SULFATE 1 MG/ML IJ SOLN: 0.2500 mg | Freq: Once | INTRAMUSCULAR | Status: DC | PRN

## 2021-08-12 MED ORDER — SODIUM CHLORIDE 0.9 % IV SOLN
INTRAVENOUS | Status: AC
  Filled 2021-08-12: qty 5

## 2021-08-12 MED ORDER — LACTATED RINGERS IV SOLN: INTRAVENOUS | Status: DC

## 2021-08-12 MED ORDER — MISOPROSTOL 200 MCG PO TABS
ORAL_TABLET | ORAL | Status: AC
  Administered 2021-08-12: 800 ug
  Filled 2021-08-12: qty 4

## 2021-08-12 MED ORDER — FENTANYL-BUPIVACAINE-NACL 0.5-0.125-0.9 MG/250ML-% EP SOLN
EPIDURAL | Status: AC
  Filled 2021-08-12: qty 250

## 2021-08-12 MED ORDER — LIDOCAINE HCL (PF) 1 % IJ SOLN
30.0000 mL | INTRAMUSCULAR | Status: DC | PRN
  Filled 2021-08-12: qty 30

## 2021-08-12 MED ORDER — SOD CITRATE-CITRIC ACID 500-334 MG/5ML PO SOLN: 30.0000 mL | ORAL | Status: DC | PRN

## 2021-08-12 MED ORDER — DIPHENHYDRAMINE HCL 50 MG/ML IJ SOLN: 12.5000 mg | INTRAMUSCULAR | Status: DC | PRN

## 2021-08-12 MED ORDER — OXYTOCIN BOLUS FROM INFUSION
333.0000 mL | Freq: Once | INTRAVENOUS | Status: AC
  Administered 2021-08-12: 333 mL via INTRAVENOUS

## 2021-08-12 MED ORDER — OXYCODONE-ACETAMINOPHEN 5-325 MG PO TABS: 2.0000 | ORAL_TABLET | ORAL | Status: DC | PRN

## 2021-08-12 MED ORDER — LACTATED RINGERS IV SOLN: 500.0000 mL | INTRAVENOUS | Status: DC | PRN

## 2021-08-12 MED ORDER — IBUPROFEN 600 MG PO TABS
600.0000 mg | ORAL_TABLET | Freq: Four times a day (QID) | ORAL | Status: DC
  Administered 2021-08-13 (×4): 600 mg via ORAL
  Filled 2021-08-12 (×6): qty 1

## 2021-08-12 MED ORDER — SODIUM CHLORIDE 0.9 % IV SOLN
5.0000 10*6.[IU] | Freq: Once | INTRAVENOUS | Status: AC
  Administered 2021-08-12: 5 10*6.[IU] via INTRAVENOUS

## 2021-08-12 MED ORDER — PENICILLIN G POTASSIUM 5000000 UNITS IJ SOLR: 3.0000 10*6.[IU] | INTRAMUSCULAR | Status: DC

## 2021-08-12 NOTE — Discharge Instructions (Signed)
Discharge Instructions:  ? ?Follow-up Appointment: 1 week, please call the office to schedule ? ?If there are any new medications, they have been ordered and will be available for pickup at the listed pharmacy on your way home from the hospital.  ? ?Call the office if you have any of the following: headache, visual changes, fever >101.0 F, chills, shortness of breath, breast concerns, excessive vaginal bleeding, incision drainage or problems, leg pain or redness, depression or any other concerns. If you have vaginal discharge with an odor, let your doctor know.  ? ?It is normal to bleed for up to 6 weeks. You should not soak through more than 1 pad in 1 hour. If you have a blood clot larger than your fist with continued bleeding, call your doctor.  ? ?No intercourse, tampons, swimming pools, hot tubs for 6 weeks.  ?No driving for 1-2 weeks. Do not drive while taking narcotic or opioid pain medication.  ?Continue taking your prenatal vitamin, especially if breastfeeding. ?Increase calories and fluids (water) while breastfeeding.  ? ?Your milk will come in, in the next couple of days (right now it is colostrum). You may have a slight fever when your milk comes in, but it should go away on its own.  If it does not, and rises above 101 F please call the doctor. You will also feel achy and your breasts will be firm. They will also start to leak. If you are breastfeeding, continue as you have been and you can pump/express milk for comfort.  ? ?If you have too much milk, your breasts can become engorged, which could lead to mastitis. This is an infection of the milk ducts. It can be very painful and you will need to notify your doctor to obtain a prescription for antibiotics. You can also treat it with a shower or hot/cold compress.  ? ?For concerns about your baby, please call your pediatrician.  ?For breastfeeding concerns, the lactation consultant can be reached at [phone].  ? ?Postpartum blues (feelings of happy  one minute and sad another minute) are normal for the first few weeks but if it gets worse let your doctor know.  ? ?Congratulations! We enjoyed caring for you and your new bundle of joy!  ? ?

## 2021-08-12 NOTE — Discharge Summary (Signed)
Postpartum Discharge Summary  Patient Name: Monique Reyes DOB: 24-Feb-1996 MRN: [id number]  Date of admission: 08/12/2021 Delivery date:08/12/2021  Delivering provider: Adrian Prows R  Date of discharge: 08/14/2021  Admitting diagnosis: Gestational diabetes [O24.419] Intrauterine pregnancy: [redacted]w[redacted]d    Secondary diagnosis:  Principal Problem:   Labor and delivery indication for care or intervention Active Problems:   Obesity affecting pregnancy, antepartum   Gestational diabetes  Additional problems: None    Discharge diagnosis: Term Pregnancy Delivered                                              Post partum procedures: none Augmentation: AROM and Pitocin Complications: None  Hospital course: Induction of Labor With Vaginal Delivery   25y.o. yo G1P0000 at 34w3das admitted to the hospital 08/12/2021 for induction of labor.  Indication for induction: A2 DM.  Patient had an uncomplicated labor course as follows: Membrane Rupture Time/Date: 3:00 PM ,08/12/2021   Delivery Method:Vaginal, Spontaneous  Episiotomy: None  Lacerations:  2nd degree;Labial;Perineal  Details of delivery can be found in separate delivery note.  Patient had a routine postpartum course. Patient is discharged home 08/14/21.  Newborn Data: Birth date:08/12/2021  Birth time:6:29 PM  Gender:Female  Living status:Living  Apgars:9 ,9  Weight:3310 g   Magnesium Sulfate received: No BMZ received: No Rhophylac:No MMR:No T-DaP:Given prenatally Flu: No Transfusion:No  Physical exam  Vitals:   08/13/21 1145 08/13/21 1602 08/13/21 2325 08/14/21 0800  BP: 111/60 123/63 125/76 124/64  Pulse: 77 65 82 75  Resp: _0 Temp: 97.8 F (36.6 C) 98.2 F (36.8 C) 98.2 F (36.8 C) 98.4 F (36.9 C)  TempSrc: Oral Oral Oral Oral  SpO2:   99% 97%  Weight:      Height:       General: alert, cooperative, and no distress Lochia: appropriate Uterine Fundus: firm Incision: N/A DVT Evaluation: No  evidence of DVT seen on physical exam. Labs: Lab Results  Component Value Date   WBC 14.9 (H) 08/13/2021   HGB 9.6 (L) 08/13/2021   HCT 29.0 (L) 08/13/2021   MCV 86.8 08/13/2021   PLT 331 08/13/2021   CMP Latest Ref Rng & Units 03/18/2021  Glucose 70 - 99 mg/dL 83  BUN 6 - 20 mg/dL 5(L)  Creatinine 0.44 - 1.00 mg/dL 0.51  Sodium 135 - 145 mmol/L 133(L)  Potassium 3.5 - 5.1 mmol/L 3.2(L)  Chloride 98 - 111 mmol/L 103  CO2 22 - 32 mmol/L 20(L)  Calcium 8.9 - 10.3 mg/dL 9.1  Total Protein 6.4 - 8.6 g/dL -  Total Bilirubin 0.2 - 1.0 mg/dL -  Alkaline Phos Unit/L -  AST 0 - 26 Unit/L -  ALT U/L -   Edinburgh Score: Edinburgh Postnatal Depression Scale Screening Tool 08/13/2021  I have been able to laugh and see the funny side of things. 0  I have looked forward with enjoyment to things. 0  I have blamed myself unnecessarily when things went wrong. 1  I have been anxious or worried for no good reason. 0  I have felt scared or panicky for no good reason. 0  Things have been getting on top of me. 0  I have been so unhappy that I have had difficulty sleeping. 0  I have felt sad or miserable. 0  I have been  so unhappy that I have been crying. 0  The thought of harming myself has occurred to me. 0  Edinburgh Postnatal Depression Scale Total 1      After visit meds:  Allergies as of 08/14/2021   No Known Allergies      Medication List     STOP taking these medications    glucose blood test strip   metFORMIN 500 MG tablet Commonly known as: GLUCOPHAGE   ONE TOUCH ULTRA 2 w/Device Kit       TAKE these medications    multivitamin-prenatal 27-0.8 MG Tabs tablet Take 1 tablet by mouth daily at 12 noon.   norethindrone 0.35 MG tablet Commonly known as: MICRONOR Take 1 tablet (0.35 mg total) by mouth daily. Start in 2-3 weeks   VITAMIN C PO Take 1 tablet by mouth daily.         Discharge home in stable condition Infant Feeding: Breast Infant  Disposition:home with mother Discharge instruction: per After Visit Summary and Postpartum booklet. Activity: Advance as tolerated. Pelvic rest for 6 weeks.  Diet: routine diet Anticipated Birth Control: POPs Postpartum Appointment:6 weeks Additional Postpartum F/U: Postpartum Depression checkup Future Appointments: Future Appointments  Date Time Provider [place]  09/10/2021  9:50 AM Schuman, Stefanie Libel, MD [facility] None   Follow up Visit:  Follow-up Information     Homero Fellers, MD Follow up on 09/10/2021.   Specialty: Obstetrics and Gynecology Why: Follow-up appointment with Dr. Gilman Schmidt at [facility]: Friday, September 16th at 9:45am! Contact information: [address] [place] [address] [phone]                     08/14/2021 Hoyt Koch, MD

## 2021-08-12 NOTE — Anesthesia Procedure Notes (Signed)
Epidural Patient location during procedure: OB Start time: 08/12/2021 3:32 PM End time: 08/12/2021 3:45 PM  Staffing Anesthesiologist: Corinda Gubler, MD Resident/CRNA: Jeanine Luz, CRNA Performed: resident/CRNA   Preanesthetic Checklist Completed: patient identified, IV checked, site marked, risks and benefits discussed, surgical consent, monitors and equipment checked and pre-op evaluation  Epidural Patient position: sitting Prep: ChloraPrep Patient monitoring: heart rate, continuous pulse ox and blood pressure Approach: midline Location: L3-L4 Injection technique: LOR air  Needle:  Needle type: Tuohy  Needle gauge: 17 G Needle length: 9 cm and 9 Needle insertion depth: 8 cm Catheter type: closed end flexible Catheter size: 19 Gauge Test dose: negative, Other and 1.5% lidocaine with Epi 1:200 K  Assessment Events: blood not aspirated, injection not painful, no injection resistance, no paresthesia and negative IV test  Additional Notes 1 attempt Pt. Evaluated and documentation done after procedure finished. Patient identified. Risks/Benefits/Options discussed with patient including but not limited to bleeding, infection, nerve damage, paralysis, failed block, incomplete pain control, headache, blood pressure changes, nausea, vomiting, reactions to medication both or allergic, itching and postpartum back pain. Confirmed with bedside nurse the patient's most recent platelet count. Confirmed with patient that they are not currently taking any anticoagulation, have any bleeding history or any family history of bleeding disorders. Patient expressed understanding and wished to proceed. All questions were answered. Sterile technique was used throughout the entire procedure. Please see nursing notes for vital signs. Test dose was given through epidural catheter and negative prior to continuing to dose epidural or start infusion. Warning signs of high block given to the patient including  shortness of breath, tingling/numbness in hands, complete motor block, or any concerning symptoms with instructions to call for help. Patient was given instructions on fall risk and not to get out of bed. All questions and concerns addressed with instructions to call with any issues or inadequate analgesia.    Patient tolerated the insertion well without immediate complications.Reason for block:procedure for pain

## 2021-08-12 NOTE — H&P (Signed)
Monique Reyes is an 25 y.o. female.   Chief Complaint: Planned induction of labor for gestational diabetes HPI: She presents today for IOL. She is feeling well. She is having normal fetal movement. She denies vaginal bleeding and leakage of fluid.   08/05/2021   Est. FW:    3149  gm    6 lb 15 oz      80  %   Pregnancy #1 Problems (from 01/19/21 to present)     Problem Noted Resolved   Gestational diabetes 07/02/2021 by Imagene Riches, CNM No   Overview Signed 07/02/2021  5:00 PM by Imagene Riches, CNM    Failed 1hr GTT and 3hr GTT. Referred by Kenton Kingfisher to [facility].      Encounter for supervision of normal first pregnancy in second trimester 01/19/2021 by Will Bonnet, MD No   Overview Addendum 08/05/2021  7:16 AM by Imagene Riches, [place] Prenatal Labs  Dating By 9w u/s Blood type: A/Positive/-- (01/25 0935)   Genetic Screen  declined Antibody:Negative (01/25 0935)  Anatomic [place] Normal female Addison Rubella: 1.07 (01/25 0935)  Varicella: non-immune  GTT Early:  112              Third trimester: 151  3hr: all 4 abn RPR: Non Reactive (01/25 0935)   Rhogam Not needed  HBsAg: Negative (01/25 0935)   Vaccines TDAP:    06/23/2021                 Flu Shot:no Covid:unvaccinated HIV: Non Reactive (01/25 0935)   Baby Food   Breast                             GBS:  positive GC/CT:  Contraception  unsure - possibly POP Pap: 2022  Childbirth Ed   resources provided, declined   CS/VBAC    Support Person  Marjory Lies, husband          Obesity affecting pregnancy, antepartum 01/19/2021 by Will Bonnet, MD No        Past Medical History:  Diagnosis Date   Gestational diabetes    Migraine     Past Surgical History:  Procedure Laterality Date   ADENOIDECTOMY     TYMPANOSTOMY TUBE PLACEMENT     TYMPANOSTOMY TUBE PLACEMENT      Family History  Problem Relation Age of Onset   Healthy Mother    Other Father        unknown medical history   Diabetes  Maternal Grandfather    Ovarian cancer Other    Colon cancer Other    Social History:  reports that she has never smoked. She has quit using smokeless tobacco. She reports that she does not currently use alcohol. She reports that she does not use drugs.  Allergies: No Known Allergies  Medications Prior to Admission  Medication Sig Dispense Refill   Ascorbic Acid (VITAMIN C PO) Take 1 tablet by mouth daily.     Blood Glucose Monitoring Suppl (ONE TOUCH ULTRA 2) w/Device KIT 4 (four) times daily.     glucose blood test strip Use as instructed 100 each 12   metFORMIN (GLUCOPHAGE) 500 MG tablet Take 1 tablet (500 mg total) by mouth daily at 6 PM. 30 tablet 1   Prenatal Vit-Fe Fumarate-FA (MULTIVITAMIN-PRENATAL) 27-0.8 MG TABS tablet Take 1 tablet by mouth daily at 12 noon.      Results  for orders placed or performed during the hospital encounter of 08/12/21 (from the past 48 hour(s))  CBC     Status: Abnormal   Collection Time: 08/12/21  8:35 AM  Result Value Ref Range   WBC 9.4 4.0 - 10.5 K/uL   RBC 3.68 (L) 3.87 - 5.11 MIL/uL   Hemoglobin 10.4 (L) 12.0 - 15.0 g/dL   HCT 31.9 (L) 36.0 - 46.0 %   MCV 86.7 80.0 - 100.0 fL   MCH 28.3 26.0 - 34.0 pg   MCHC 32.6 30.0 - 36.0 g/dL   RDW 14.2 11.5 - 15.5 %   Platelets 395 150 - 400 K/uL   nRBC 0.0 0.0 - 0.2 %    Comment: Performed at [facility], [address] [place], [place] [address]  Type and screen [place]     Status: None (Preliminary result)   Collection Time: 08/12/21  8:35 AM  Result Value Ref Range   ABO/RH(D) PENDING    Antibody Screen PENDING    Sample Expiration      08/15/2021,2359 Performed at [facility], [address] [place], [place] [address]   Glucose, capillary     Status: Abnormal   Collection Time: 08/12/21  8:47 AM  Result Value Ref Range   Glucose-Capillary 153 (H) 70 - 99 mg/dL    Comment: Glucose reference range applies only to samples taken after fasting  for at least 8 hours.   Comment 1 Notify RN   Glucose, capillary     Status: Abnormal   Collection Time: 08/12/21  9:52 AM  Result Value Ref Range   Glucose-Capillary 121 (H) 70 - 99 mg/dL    Comment: Glucose reference range applies only to samples taken after fasting for at least 8 hours.   Comment 1 Notify RN    No results found.  Review of Systems  Constitutional:  Negative for chills and fever.  HENT:  Negative for congestion, hearing loss and sinus pain.   Respiratory:  Negative for cough, shortness of breath and wheezing.   Cardiovascular:  Negative for chest pain, palpitations and leg swelling.  Gastrointestinal:  Negative for abdominal pain, constipation, diarrhea, nausea and vomiting.  Genitourinary:  Negative for dysuria, flank pain, frequency, hematuria and urgency.  Musculoskeletal:  Negative for back pain.  Skin:  Negative for rash.  Neurological:  Negative for dizziness and headaches.  Psychiatric/Behavioral:  Negative for suicidal ideas. The patient is not nervous/anxious.    Blood pressure 129/70, pulse 91, temperature 98.9 F (37.2 C), temperature source Oral, resp. rate 16, height _0  (1.702 m), weight 110.2 kg, last menstrual period 12/02/2020, SpO2 99 %. Physical Exam Vitals and nursing note reviewed.  Constitutional:      Appearance: Normal appearance. She is well-developed.  HENT:     Head: Normocephalic and atraumatic.  Cardiovascular:     Rate and Rhythm: Normal rate and regular rhythm.  Pulmonary:     Effort: Pulmonary effort is normal.     Breath sounds: Normal breath sounds.  Abdominal:     General: Bowel sounds are normal.     Palpations: Abdomen is soft.  Musculoskeletal:        General: Normal range of motion.  Skin:    General: Skin is warm and dry.  Neurological:     Mental Status: She is alert and oriented to person, place, and time.  Psychiatric:        Behavior: Behavior normal.  Thought Content: Thought content normal.         Judgment: Judgment normal.     Assessment/Plan 25 y.o. G1P0000 88w3dhere for scheduled induction of labor. Induction has been planned because of gestational diabetes.  Will begin induction with pitocin.  Normal fetal monitoring per unit policy Reviewed option for pain management with patient. Patient may desire an epidural.  Normal admission labs GBS: positive- PCN started Monitor glucose every 4 hours in early labor, hourly in active labor. Start Endotool if glucose is above 140.   CAdrian ProwsMD, FLou[facility] PardonOB/GYN, [facility] 08/12/2021 10:29 AM

## 2021-08-12 NOTE — Progress Notes (Signed)
AROM, clear fluid No complications Fetal category I tracing. Continue with Pitocin for IOL.  SVE: 4/80/-2  Adelene Idler MD, [facility] 08/12/2021 3:08 PM

## 2021-08-12 NOTE — Anesthesia Preprocedure Evaluation (Signed)
Anesthesia Evaluation  Patient identified by MRN, date of birth, ID band Patient awake    Reviewed: Allergy & Precautions, H&P , NPO status   Airway Mallampati: II  TM Distance: >3 FB Neck ROM: full    Dental no notable dental hx.    Pulmonary neg pulmonary ROS,    Pulmonary exam normal        Cardiovascular negative cardio ROS Normal cardiovascular exam     Neuro/Psych  Headaches, negative psych ROS   GI/Hepatic negative GI ROS, Neg liver ROS,   Endo/Other  diabetes  Renal/GU negative Renal ROS  negative genitourinary   Musculoskeletal   Abdominal   Peds  Hematology negative hematology ROS (+)   Anesthesia Other Findings   Reproductive/Obstetrics (+) Pregnancy                             Anesthesia Physical Anesthesia Plan  ASA: 2  Anesthesia Plan: Epidural   Post-op Pain Management:    Induction:   PONV Risk Score and Plan:   Airway Management Planned:   Additional Equipment:   Intra-op Plan:   Post-operative Plan:   Informed Consent:   Plan Discussed with: CRNA and Anesthesiologist  Anesthesia Plan Comments:         Anesthesia Quick Evaluation

## 2021-08-13 LAB — CBC
HCT: 29 % — ABNORMAL LOW (ref 36.0–46.0)
Hemoglobin: 9.6 g/dL — ABNORMAL LOW (ref 12.0–15.0)
MCH: 28.7 pg (ref 26.0–34.0)
MCHC: 33.1 g/dL (ref 30.0–36.0)
MCV: 86.8 fL (ref 80.0–100.0)
Platelets: 331 10*3/uL (ref 150–400)
RBC: 3.34 MIL/uL — ABNORMAL LOW (ref 3.87–5.11)
RDW: 14.2 % (ref 11.5–15.5)
WBC: 14.9 10*3/uL — ABNORMAL HIGH (ref 4.0–10.5)
nRBC: 0 % (ref 0.0–0.2)

## 2021-08-13 LAB — RPR: RPR Ser Ql: NONREACTIVE

## 2021-08-13 MED ORDER — ZOLPIDEM TARTRATE 5 MG PO TABS: 5.0000 mg | ORAL_TABLET | Freq: Every evening | ORAL | Status: DC | PRN

## 2021-08-13 MED ORDER — DOCUSATE SODIUM 100 MG PO CAPS
100.0000 mg | ORAL_CAPSULE | Freq: Two times a day (BID) | ORAL | Status: DC
  Administered 2021-08-13 – 2021-08-14 (×3): 100 mg via ORAL
  Filled 2021-08-13 (×2): qty 1

## 2021-08-13 MED ORDER — SIMETHICONE 80 MG PO CHEW: 80.0000 mg | CHEWABLE_TABLET | ORAL | Status: DC | PRN

## 2021-08-13 MED ORDER — DIPHENHYDRAMINE HCL 25 MG PO CAPS: 25.0000 mg | ORAL_CAPSULE | Freq: Four times a day (QID) | ORAL | Status: DC | PRN

## 2021-08-13 MED ORDER — ONDANSETRON HCL 4 MG/2ML IJ SOLN: 4.0000 mg | INTRAMUSCULAR | Status: DC | PRN

## 2021-08-13 MED ORDER — PRENATAL MULTIVITAMIN CH
1.0000 | ORAL_TABLET | Freq: Every day | ORAL | Status: DC
  Administered 2021-08-13: 1 via ORAL
  Filled 2021-08-13: qty 1

## 2021-08-13 MED ORDER — ONDANSETRON HCL 4 MG PO TABS: 4.0000 mg | ORAL_TABLET | ORAL | Status: DC | PRN

## 2021-08-13 MED ORDER — COCONUT OIL OIL
1.0000 "application " | TOPICAL_OIL | Status: DC | PRN
  Administered 2021-08-13: 1 via TOPICAL
  Filled 2021-08-13: qty 120

## 2021-08-13 MED ORDER — ACETAMINOPHEN 500 MG PO TABS
1000.0000 mg | ORAL_TABLET | Freq: Four times a day (QID) | ORAL | Status: DC
  Administered 2021-08-13 (×3): 1000 mg via ORAL
  Filled 2021-08-13 (×5): qty 2

## 2021-08-13 MED ORDER — BENZOCAINE-MENTHOL 20-0.5 % EX AERO: 1.0000 "application " | INHALATION_SPRAY | CUTANEOUS | Status: DC | PRN

## 2021-08-13 NOTE — Anesthesia Postprocedure Evaluation (Signed)
Anesthesia Post Note  Patient: Monique Reyes  Procedure(s) Performed: AN AD HOC LABOR EPIDURAL  Patient location during evaluation: Nursing Unit Anesthesia Type: Epidural Level of consciousness: awake Pain management: pain level controlled Vital Signs Assessment: post-procedure vital signs reviewed and stable Respiratory status: spontaneous breathing Cardiovascular status: stable Postop Assessment: no headache Anesthetic complications: no   No notable events documented.   Last Vitals:  Vitals:   08/13/21 0040 08/13/21 0603  BP: 130/68 130/67  Pulse: 79 80  Resp: 18 20  Temp: 36.7 C (!) 36.3 C  SpO2:  100%    Last Pain:  Vitals:   08/13/21 0603  TempSrc: Oral  PainSc:                  Monique Reyes

## 2021-08-13 NOTE — Progress Notes (Signed)
Obstetric Postpartum Daily Progress Note  Chief Complaint: Postpartum care after vaginal delivery  Subjective:  25 y.o. G1P1001 postpartum day #1 status post vaginal delivery.  She is ambulating, is tolerating po, is voiding spontaneously.  Her pain is well controlled on PO pain medications. Her lochia is less than menses.   Medications SCHEDULED MEDICATIONS   acetaminophen  1,000 mg Oral Q6H   docusate sodium  100 mg Oral BID   ibuprofen  600 mg Oral Q6H   prenatal multivitamin  1 tablet Oral Q1200    MEDICATION INFUSIONS    PRN MEDICATIONS  benzocaine-Menthol, coconut oil, witch hazel-glycerin **AND** dibucaine, diphenhydrAMINE, ondansetron **OR** ondansetron (ZOFRAN) IV, simethicone, zolpidem    Objective:   Vitals:   08/12/21 2312 08/13/21 0040 08/13/21 0603 08/13/21 0813  BP: (!) 139/55 130/68 130/67 (!) 121/58  Pulse: 82 79 80 76  Resp: 20 18 20 20   Temp: 99 F (37.2 C) 98 F (36.7 C) (!) 97.4 F (36.3 C) 98.1 F (36.7 C)  TempSrc: Oral  Oral Oral  SpO2: 99%  100% 98%  Weight:      Height:        Current Vital Signs 24h Vital Sign Ranges  T 98.1 F (36.7 C) Temp  Avg: 98.2 F (36.8 C)  Min: 97.4 F (36.3 C)  Max: 99 F (37.2 C)  BP (!) 121/58 (nurse notified)  BP  Min: 113/48  Max: 139/55  HR 76 Pulse  Avg: 81.3  Min: 63  Max: 100  RR 20 Resp  Avg: 19.2  Min: 18  Max: 20  SaO2 98 % Room Air SpO2  Avg: 98.4 %  Min: 97 %  Max: 100 %       24 Hour I/O Current Shift I/O  Time Ins Outs 08/18 0701 - 08/19 0700 In: -  Out: 1195 [Urine:700] No intake/output data recorded.  General: NAD Pulmonary: no increased work of breathing Abdomen: non-distended, non-tender, fundus firm at level of umbilicus Extremities: no edema, no erythema, no tenderness  Labs:  Recent Labs  Lab 08/12/21 0835 08/13/21 0805  WBC 9.4 14.9*  HGB 10.4* 9.6*  HCT 31.9* 29.0*  PLT 395 331     Assessment:   24 y.o. G1P1001 postpartum day # 1 status post SVD  Plan:   1)  Acute blood loss anemia - hemodynamically stable and asymptomatic - po ferrous sulfate  2) A POS Performed at [facility], [address] [place], [place]  /   / [address] 1.07 (01/25 0935)/ Varicella Immune  3) breast feeding/Contraception = oral progesterone-only contraceptive  4) Disposition: Home tomorrow  05-27-1982, MD 08/13/2021 9:54 AM

## 2021-08-13 NOTE — Lactation Note (Addendum)
This note was copied from a baby's chart. Lactation Consultation Note  Patient Name: Monique Reyes ZDGLO'V Date: 08/13/2021 Reason for consult: Initial assessment;Primapara;Early term 37-38.6wks Age:25 hours  Maternal Data Has patient been taught Hand Expression?: Yes Does the patient have breastfeeding experience prior to this delivery?: No  Feeding Mother's Current Feeding Choice: Breast Milk Assisted mom with latching baby to breast in left side lying position, baby sleepy, curls tongue in mouth, but will suck on gloved finger, able to get a latch with sandwiching and shaping breast, baby would suck a few times with mom feeling tugs and let go, did this on and off x 5 min, transferred to right breast in side lying position, would latch with  shaping breast and then let go x 3 min, mom felt tugs, mom's breast tissue is soft and compressible, nipples tend to flatten when mom tries to sandwich tissue    LATCH Score Latch: Repeated attempts needed to sustain latch, nipple held in mouth throughout feeding, stimulation needed to elicit sucking reflex.  Audible Swallowing: None  Type of Nipple: Everted at rest and after stimulation  Comfort (Breast/Nipple): Soft / non-tender  Hold (Positioning): Assistance needed to correctly position infant at breast and maintain latch.  LATCH Score: 6   Lactation Tools Discussed/Used  LC name and no written on white board May need a nipple shield if baby does not stay on breast  for longer periods tonight Interventions Interventions: Breast feeding basics reviewed;Assisted with latch;Skin to skin;Hand express;Breast compression;Adjust position;DEBP;Education  Discharge Pump: Personal WIC Program: Yes Mom has a [employer] breast pump at home Consult Status Consult Status: Follow-up Date: 08/14/21 Follow-up type: In-patient    [name] 08/13/2021, 6:21 PM

## 2021-08-14 MED ORDER — NORETHINDRONE 0.35 MG PO TABS: 1.0000 | ORAL_TABLET | Freq: Every day | ORAL | 11 refills | Status: DC

## 2021-08-14 NOTE — Progress Notes (Signed)
Mother discharged.  Discharge instructions given.  Mother verbalizes understanding.  Transported by auxiliary.  

## 2021-08-14 NOTE — Progress Notes (Deleted)
Patient discharged home.  Discharge instructions given to parents. 200 ML of frozen donor milk given to patient- Lot # [id number] EXP. 12/31/2021. Mother verbalized understanding.  Tag removed, bands matched, escorted by auxiliary.

## 2021-08-17 ENCOUNTER — Encounter: Payer: Managed Care, Other (non HMO) | Admitting: Obstetrics and Gynecology

## 2021-09-10 ENCOUNTER — Encounter: Payer: Self-pay | Admitting: Obstetrics and Gynecology

## 2021-09-10 ENCOUNTER — Other Ambulatory Visit: Payer: Self-pay

## 2021-09-10 ENCOUNTER — Ambulatory Visit (INDEPENDENT_AMBULATORY_CARE_PROVIDER_SITE_OTHER): Payer: Managed Care, Other (non HMO) | Admitting: Obstetrics and Gynecology

## 2021-09-10 NOTE — Progress Notes (Signed)
Postpartum Visit  Chief Complaint:  Chief Complaint  Patient presents with   Postpartum Care    History of Present Illness: Patient is a 25 y.o. G1P1001 presents for postpartum visit.  Date of delivery: 08/12/2021 Type of delivery: Vaginal Laceration: 2nd degree perineal and right labial laceration  Pregnancy or labor problems: no  Breast Feeding:  yes Lochia: flow about like a period Post partum depression/anxiety noted:  no Edinburgh Post-Partum Depression Score: 0  Date of last PAP: 01/21/2021  abnormal   Any problems since the delivery:  none  She reports she has been feeling well. She started her POP this week and started having vaginal bleeding.  Newborn Details:  SINGLETON :  1. Baby Gender:female.  Infant Status: Infant doing well at home with mother.   Review of Systems: Review of Systems  Constitutional:  Negative for chills, fever, malaise/fatigue and weight loss.  HENT:  Negative for congestion, hearing loss and sinus pain.   Eyes:  Negative for blurred vision and double vision.  Respiratory:  Negative for cough, sputum production, shortness of breath and wheezing.   Cardiovascular:  Negative for chest pain, palpitations, orthopnea and leg swelling.  Gastrointestinal:  Negative for abdominal pain, constipation, diarrhea, nausea and vomiting.  Genitourinary:  Negative for dysuria, flank pain, frequency, hematuria and urgency.  Musculoskeletal:  Negative for back pain, falls and joint pain.  Skin:  Negative for itching and rash.  Neurological:  Negative for dizziness and headaches.  Psychiatric/Behavioral:  Negative for depression, substance abuse and suicidal ideas. The patient is not nervous/anxious.    Past Medical History:  Past Medical History:  Diagnosis Date   Gestational diabetes    Migraine     Past Surgical History:  Past Surgical History:  Procedure Laterality Date   ADENOIDECTOMY     TYMPANOSTOMY TUBE PLACEMENT     TYMPANOSTOMY TUBE  PLACEMENT      Family History:  Family History  Problem Relation Age of Onset   Healthy Mother    Other Father        unknown medical history   Diabetes Maternal Grandfather    Ovarian cancer Other    Colon cancer Other     Social History:  Social History   Socioeconomic History   Marital status: Married    Spouse name: Not on file   Number of children: Not on file   Years of education: Not on file   Highest education level: Not on file  Occupational History   Not on file  Tobacco Use   Smoking status: Never   Smokeless tobacco: Former   Tobacco comments:    Vaping - not since pregnant  Vaping Use   Vaping Use: Never used  Substance and Sexual Activity   Alcohol use: Not Currently    Comment: social   Drug use: No   Sexual activity: Yes    Birth control/protection: Pill  Other Topics Concern   Not on file  Social History Narrative   Not on file   Social Determinants of Health   Financial Resource Strain: Not on file  Food Insecurity: Not on file  Transportation Needs: Not on file  Physical Activity: Not on file  Stress: Not on file  Social Connections: Not on file  Intimate Partner Violence: Not on file    Allergies:  No Known Allergies  Medications: Prior to Admission medications   Medication Sig Start Date End Date Taking? Authorizing Provider  norethindrone (MICRONOR) 0.35 MG tablet Take 1  tablet (0.35 mg total) by mouth daily. Start in 2-3 weeks 08/14/21  Yes Nadara Mustard, MD  Prenatal Vit-Fe Fumarate-FA (MULTIVITAMIN-PRENATAL) 27-0.8 MG TABS tablet Take 1 tablet by mouth daily at 12 noon.   Yes [provider]  Ascorbic Acid (VITAMIN C PO) Take 1 tablet by mouth daily.    [provider]  levonorgestrel-ethinyl estradiol (SRONYX) 0.1-20 MG-MCG tablet Take 1 tablet by mouth daily. 12/20/18 02/11/20  Copland, Ilona Sorrel, PA-C    Physical Exam Vitals:  Vitals:   09/10/21 0955  BP: 118/72    Physical Exam Constitutional:       Appearance: Normal appearance. She is well-developed.  Genitourinary:     Genitourinary Comments: External: Normal appearing vulva. Suture present at introitus, clean wound, Laceration still healing.  HENT:     Head: Normocephalic and atraumatic.  Eyes:     Extraocular Movements: Extraocular movements intact.     Pupils: Pupils are equal, round, and reactive to light.  Neck:     Thyroid: No thyromegaly.  Cardiovascular:     Rate and Rhythm: Normal rate and regular rhythm.     Heart sounds: Normal heart sounds.  Pulmonary:     Effort: Pulmonary effort is normal.     Breath sounds: Normal breath sounds.  Abdominal:     General: Bowel sounds are normal. There is no distension.     Palpations: Abdomen is soft. There is no mass.  Musculoskeletal:     Cervical back: Neck supple.  Neurological:     Mental Status: She is alert and oriented to person, place, and time.  Skin:    General: Skin is warm and dry.  Psychiatric:        Behavior: Behavior normal.        Thought Content: Thought content normal.        Judgment: Judgment normal.  Vitals reviewed.    Assessment: 25 y.o. G1P1001 presenting for 6 week postpartum visit  Plan: Problem List Items Addressed This Visit   None Visit Diagnoses     Postpartum care and examination    -  Primary       1) Contraception-  POP  2)  Pap: 2022 ASCUS HPV neg- 3 year follow up recommended  3) Patient underwent screening for postpartum depression with no concerns noted.  4) History of gestational diabetes with pregnancy- plan postpartum glucose screening next visit.  - Follow up 2-3 weeks   Adelene Idler MD, [facility], [facility] 09/10/2021 10:26 AM

## 2021-09-13 ENCOUNTER — Other Ambulatory Visit: Payer: Self-pay | Admitting: Obstetrics & Gynecology

## 2021-09-13 DIAGNOSIS — O24410 Gestational diabetes mellitus in pregnancy, diet controlled: Secondary | ICD-10-CM

## 2021-09-28 ENCOUNTER — Other Ambulatory Visit: Payer: Self-pay

## 2021-09-28 ENCOUNTER — Encounter: Payer: Self-pay | Admitting: Obstetrics and Gynecology

## 2021-09-28 ENCOUNTER — Ambulatory Visit (INDEPENDENT_AMBULATORY_CARE_PROVIDER_SITE_OTHER): Payer: Managed Care, Other (non HMO) | Admitting: Obstetrics and Gynecology

## 2021-09-28 DIAGNOSIS — Z3009 Encounter for other general counseling and advice on contraception: Secondary | ICD-10-CM

## 2021-09-28 MED ORDER — NORETHINDRONE 0.35 MG PO TABS: 1.0000 | ORAL_TABLET | Freq: Every day | ORAL | 11 refills | Status: DC

## 2021-09-28 NOTE — Progress Notes (Signed)
Postpartum Visit  Chief Complaint:  Chief Complaint  Patient presents with   Postpartum Care    History of Present Illness: Patient is a 25 y.o. G1P1001 presents for postpartum visit.  Date of delivery: 08/12/2021 Type of delivery: vaginal Laceration: 2nd degree perineal and right labial  Pregnancy or labor problems: no  Breast Feeding:  yes Lochia; Normal Post partum depression/anxiety noted:  no Edinburgh Post-Partum Depression Score: 0  Date of last PAP: 01/21/2021    Any problems since the delivery:  none  She reports she has been feeling well.  Newborn Details:  SINGLETON :  1. Baby Gender:female.  Infant Status: Infant doing well at home with mother.   Review of Systems: Review of Systems  Constitutional:  Negative for chills, fever, malaise/fatigue and weight loss.  HENT:  Negative for congestion, hearing loss and sinus pain.   Eyes:  Negative for blurred vision and double vision.  Respiratory:  Negative for cough, sputum production, shortness of breath and wheezing.   Cardiovascular:  Negative for chest pain, palpitations, orthopnea and leg swelling.  Gastrointestinal:  Negative for abdominal pain, constipation, diarrhea, nausea and vomiting.  Genitourinary:  Negative for dysuria, flank pain, frequency, hematuria and urgency.  Musculoskeletal:  Negative for back pain, falls and joint pain.  Skin:  Negative for itching and rash.  Neurological:  Negative for dizziness and headaches.  Psychiatric/Behavioral:  Negative for depression, substance abuse and suicidal ideas. The patient is not nervous/anxious.    Past Medical History:  Past Medical History:  Diagnosis Date   Encounter for supervision of normal first pregnancy in second trimester 01/19/2021   Clinic [facility] Prenatal Labs Dating By 9w u/s Blood type: A/Positive/-- (01/25 0935)  Genetic Screen  declined Antibody:Negative (01/25 0935) Anatomic [place] Normal female Addison Rubella: 1.07 (01/25 0935)  Varicella:  non-immune GTT Early:  112              Third trimester: 151  3hr: all 4 abn RPR: Non Reactive (01/25 0935)  Rhogam Not needed  HBsAg: Negative (01/25 0935)  Vaccines TDAP:    6/29/202   Gestational diabetes    Migraine     Past Surgical History:  Past Surgical History:  Procedure Laterality Date   ADENOIDECTOMY     TYMPANOSTOMY TUBE PLACEMENT     TYMPANOSTOMY TUBE PLACEMENT      Family History:  Family History  Problem Relation Age of Onset   Healthy Mother    Other Father        unknown medical history   Diabetes Maternal Grandfather    Ovarian cancer Other    Colon cancer Other     Social History:  Social History   Socioeconomic History   Marital status: Married    Spouse name: Not on file   Number of children: Not on file   Years of education: Not on file   Highest education level: Not on file  Occupational History   Not on file  Tobacco Use   Smoking status: Never   Smokeless tobacco: Former   Tobacco comments:    Vaping - not since pregnant  Vaping Use   Vaping Use: Never used  Substance and Sexual Activity   Alcohol use: Not Currently    Comment: social   Drug use: No   Sexual activity: Yes    Birth control/protection: Pill  Other Topics Concern   Not on file  Social History Narrative   Not on file   Social Determinants of Health  Financial Resource Strain: Not on file  Food Insecurity: Not on file  Transportation Needs: Not on file  Physical Activity: Not on file  Stress: Not on file  Social Connections: Not on file  Intimate Partner Violence: Not on file    Allergies:  No Known Allergies  Medications: Prior to Admission medications   Medication Sig Start Date End Date Taking? Authorizing Provider  Ascorbic Acid (VITAMIN C PO) Take 1 tablet by mouth daily.   Yes [provider]  norethindrone (MICRONOR) 0.35 MG tablet Take 1 tablet (0.35 mg total) by mouth daily. Start in 2-3 weeks 08/14/21  Yes Nadara Mustard, MD   norethindrone (MICRONOR) 0.35 MG tablet Take 1 tablet (0.35 mg total) by mouth daily. 09/28/21  Yes Preet Mangano R, MD  Prenatal Vit-Fe Fumarate-FA (MULTIVITAMIN-PRENATAL) 27-0.8 MG TABS tablet Take 1 tablet by mouth daily at 12 noon.   Yes [provider]  levonorgestrel-ethinyl estradiol (SRONYX) 0.1-20 MG-MCG tablet Take 1 tablet by mouth daily. 12/20/18 02/11/20  Copland, Ilona Sorrel, PA-C    Physical Exam Vitals:  Vitals:   09/28/21 1115  BP: 110/70    Physical Exam Constitutional:      Appearance: She is well-developed.  Genitourinary:     Genitourinary Comments: External: Normal appearing vulva. No lesions noted.  Speculum examination: Normal appearing cervix. No blood in the vaginal vault. No discharge.   Bimanual examination: Uterus midline, non-tender, normal in size, shape and contour.  No CMT. No adnexal masses. No adnexal tenderness. Pelvis not fixed.  Breast Exam: exam not performed   HENT:     Head: Normocephalic and atraumatic.  Neck:     Thyroid: No thyromegaly.  Cardiovascular:     Rate and Rhythm: Normal rate and regular rhythm.     Heart sounds: Normal heart sounds.  Pulmonary:     Effort: Pulmonary effort is normal.     Breath sounds: Normal breath sounds.  Abdominal:     General: Bowel sounds are normal. There is no distension.     Palpations: Abdomen is soft. There is no mass.  Musculoskeletal:     Cervical back: Neck supple.  Neurological:     Mental Status: She is alert and oriented to person, place, and time.  Skin:    General: Skin is warm and dry.  Psychiatric:        Behavior: Behavior normal.        Thought Content: Thought content normal.        Judgment: Judgment normal.  Vitals reviewed.    Assessment: 25 y.o. G1P1001 presenting for 6 week postpartum visit  Plan: Problem List Items Addressed This Visit   None Visit Diagnoses     Birth control counseling    -  Primary   Relevant Medications   norethindrone  (MICRONOR) 0.35 MG tablet       1) Contraception-  Continue POP- rx sent.   2)  Pap: performed today. Last pap ASCUS HPV negative  3) Patient underwent screening for postpartum depression with no concerns noted.  - Follow up as needed   Adelene Idler MD, [facility], [facility] 09/28/2021 11:58 AM

## 2021-11-17 ENCOUNTER — Telehealth: Payer: Self-pay

## 2021-11-17 NOTE — Telephone Encounter (Signed)
Pt calling; her family has covid including the 62m old; is breastfeeding; is it okay for her to take the counteractive medication for covid?  [phone]  Adv pt we did not know; adv to contact pediatrician, lactation consultants, [facility] - [email or web address] hph# [phone]; [email or web address]

## 2022-04-14 ENCOUNTER — Ambulatory Visit
Admission: RE | Admit: 2022-04-14 | Discharge: 2022-04-14 | Disposition: A | Discharge status: Home / Self Care | Payer: 59 | Source: Ambulatory Visit | Attending: Emergency Medicine | Admitting: Emergency Medicine

## 2022-04-14 VITALS — BP 116/70 | HR 75 | Temp 99.3°F | Resp 18

## 2022-04-14 DIAGNOSIS — J069 Acute upper respiratory infection, unspecified: Secondary | ICD-10-CM | POA: Diagnosis not present

## 2022-04-14 LAB — GROUP A STREP BY PCR: Group A Strep by PCR: NOT DETECTED

## 2022-04-14 MED ORDER — LIDOCAINE VISCOUS HCL 2 % MT SOLN: 15.0000 mL | OROMUCOSAL | 0 refills | Status: DC | PRN

## 2022-04-14 NOTE — Discharge Instructions (Signed)
Your symptoms today are most likely being caused by a virus and should steadily improve in time it can take up to 7 to 10 days before you truly start to see a turnaround however things will get better ? ?Strep test negative ? ?You may gargle and spit lidocaine solution every 4 hours as needed to give a temporary numbing effect to your throat ?   ?You can take Tylenol and/or Ibuprofen as needed for fever reduction and pain relief. ?  ?For cough: honey 1/2 to 1 teaspoon (you can dilute the honey in water or another fluid).  You can also use guaifenesin and dextromethorphan for cough. You can use a humidifier for chest congestion and cough.  If you don't have a humidifier, you can sit in the bathroom with the hot shower running.    ?  ?For sore throat: try warm salt water gargles, cepacol lozenges, throat spray, warm tea or water with lemon/honey, popsicles or ice, or OTC cold relief medicine for throat discomfort. ?  ?For congestion: take a daily anti-histamine like Zyrtec, Claritin, and a oral decongestant, such as pseudoephedrine.  You can also use Flonase 1-2 sprays in each nostril daily. ?  ?It is important to stay hydrated: drink plenty of fluids (water, gatorade/powerade/pedialyte, juices, or teas) to keep your throat moisturized and help further relieve irritation/discomfort.  ?

## 2022-04-14 NOTE — ED Triage Notes (Signed)
Pt presents with throat irritation since yesterday.  Was up coughing through the night and throat was worse today.  Indirect exposure to strep throat last week.   No fevers.  ?

## 2022-04-14 NOTE — ED Provider Notes (Signed)
?[facility] ? ? ? ?CSN: [id number] ?Arrival date & time: 04/14/22  1549 ? ? ?  ? ?History   ?Chief Complaint ?Chief Complaint  ?Patient presents with  ? Sore Throat  ?  APPT 4:00   ? ? ?HPI ?Monique Reyes is a 26 y.o. female.  ? ?Patient presents with throat irritation and a mild cough that began overnight, symptoms worsened this morning.  Tolerating food and liquids.  Has not attempted treatment.  possible strep exposure.  Denies fever, chills, body aches, nasal congestion, ear pain, headaches, shortness of breath, wheezing.  History of migraines. ? ?Past Medical History:  ?Diagnosis Date  ? Encounter for supervision of normal first pregnancy in second trimester 01/19/2021  ? [facility] Prenatal Labs Dating By 9w u/s Blood type: A/Positive/-- (01/25 0935)  Genetic Screen  declined Antibody:Negative (01/25 0935) Anatomic [place] Normal female Addison Rubella: 1.07 (01/25 0935)  Varicella: non-immune GTT Early:  112              Third trimester: 151  3hr: all 4 abn RPR: Non Reactive (01/25 0935)  Rhogam Not needed  HBsAg: Negative (01/25 0935)  Vaccines TDAP:    6/29/202  ? Gestational diabetes   ? Migraine   ? ? ?There are no problems to display for this patient. ? ? ?Past Surgical History:  ?Procedure Laterality Date  ? ADENOIDECTOMY    ? TYMPANOSTOMY TUBE PLACEMENT    ? TYMPANOSTOMY TUBE PLACEMENT    ? ? ?OB History   ? ? Gravida  ?1  ? Para  ?1  ? Term  ?1  ? Preterm  ?0  ? AB  ?0  ? Living  ?1  ?  ? ? SAB  ?0  ? IAB  ?0  ? Ectopic  ?0  ? Multiple  ?0  ? Live Births  ?1  ?   ?  ?  ? ? ? ?Home Medications   ? ?Prior to Admission medications   ?Medication Sig Start Date End Date Taking? Authorizing Provider  ?norethindrone (MICRONOR) 0.35 MG tablet Take 1 tablet (0.35 mg total) by mouth daily. Start in 2-3 weeks 08/14/21  Yes Nadara Mustard, MD  ?Ascorbic Acid (VITAMIN C PO) Take 1 tablet by mouth daily.    [provider]  ?norethindrone (MICRONOR) 0.35 MG tablet Take 1 tablet (0.35 mg  total) by mouth daily. 09/28/21   Schuman, Jaquelyn Bitter, MD  ?Prenatal Vit-Fe Fumarate-FA (MULTIVITAMIN-PRENATAL) 27-0.8 MG TABS tablet Take 1 tablet by mouth daily at 12 noon.    [provider]  ?levonorgestrel-ethinyl estradiol (SRONYX) 0.1-20 MG-MCG tablet Take 1 tablet by mouth daily. 12/20/18 02/11/20  Copland, Ilona Sorrel, PA-C  ? ? ?Family History ?Family History  ?Problem Relation Age of Onset  ? Healthy Mother   ? Other Father   ?     unknown medical history  ? Diabetes Maternal Grandfather   ? Ovarian cancer Other   ? Colon cancer Other   ? ? ?Social History ?Social History  ? ?Tobacco Use  ? Smoking status: Never  ? Smokeless tobacco: Former  ? Tobacco comments:  ?  Vaping - not since pregnant  ?Vaping Use  ? Vaping Use: Never used  ?Substance Use Topics  ? Alcohol use: Not Currently  ?  Comment: social  ? Drug use: No  ? ? ? ?Allergies   ?Patient has no known allergies. ? ? ?Review of Systems ?Review of Systems ?Defer to HPI  ? ? ?  Physical Exam ?Triage Vital Signs ?ED Triage Vitals  ?Enc Vitals Group  ?   BP 04/14/22 1622 116/70  ?   Pulse Rate 04/14/22 1622 75  ?   Resp 04/14/22 1622 18  ?   Temp 04/14/22 1622 99.3 ?F (37.4 ?C)  ?   Temp Source 04/14/22 1622 Oral  ?   SpO2 04/14/22 1622 96 %  ?   Weight --   ?   Height --   ?   Head Circumference --   ?   Peak Flow --   ?   Pain Score 04/14/22 1616 4  ?   Pain Loc --   ?   Pain Edu? --   ?   Excl. in [place]? --   ? ?No data found. ? ?Updated Vital Signs ?BP 116/70 (BP Location: Left Arm)   Pulse 75   Temp 99.3 ?F (37.4 ?C) (Oral)   Resp 18   LMP 03/31/2022 (Exact Date)   SpO2 96%   Breastfeeding Yes  ? ?Visual Acuity ?Right Eye Distance:   ?Left Eye Distance:   ?Bilateral Distance:   ? ?Right Eye Near:   ?Left Eye Near:    ?Bilateral Near:    ? ?Physical Exam ?Constitutional:   ?   Appearance: She is well-developed.  ?HENT:  ?   Head: Normocephalic.  ?   Right Ear: Tympanic membrane and ear canal normal.  ?   Left Ear: Tympanic membrane and ear  canal normal.  ?   Nose: No congestion or rhinorrhea.  ?   Mouth/Throat:  ?   Mouth: Mucous membranes are moist.  ?   Pharynx: Posterior oropharyngeal erythema present.  ?   Tonsils: No tonsillar exudate. 0 on the right. 0 on the left.  ?Cardiovascular:  ?   Rate and Rhythm: Normal rate and regular rhythm.  ?Pulmonary:  ?   Effort: Pulmonary effort is normal.  ?   Breath sounds: Normal breath sounds.  ?Musculoskeletal:  ?   Cervical back: Normal range of motion and neck supple.  ?Skin: ?   General: Skin is warm and dry.  ?Neurological:  ?   General: No focal deficit present.  ?   Mental Status: She is alert and oriented to person, place, and time.  ?Psychiatric:     ?   Mood and Affect: Mood normal.     ?   Behavior: Behavior normal.  ? ? ? ?UC Treatments / Results  ?Labs ?(all labs ordered are listed, but only abnormal results are displayed) ?Labs Reviewed  ?GROUP A STREP BY PCR  ? ? ?EKG ? ? ?Radiology ?No results found. ? ?Procedures ?Procedures (including critical care time) ? ?Medications Ordered in UC ?Medications - No data to display ? ?Initial Impression / Assessment and Plan / UC Course  ?I have reviewed the triage vital signs and the nursing notes. ? ?Pertinent labs & imaging results that were available during my care of the patient were reviewed by me and considered in my medical decision making (see chart for details). ? ?Viral URI with cough ? ?Vital signs stable, low-grade fever of 99.3 noted in triage, which strep PCR negative, discussed findings with patient, etiology is symptoms are most likely viral, lidocaine viscous sent to pharmacy as this is the most worrisome symptom today, may use over-the-counter medications and home remedies for additional supportive care, may follow-up with urgent care as needed, work note given ?Final Clinical Impressions(s) / UC Diagnoses  ? ?Final diagnoses:  ?  None  ? ?Discharge Instructions   ?None ?  ? ?ED Prescriptions   ?None ?  ? ?PDMP not reviewed this  encounter. ?  ?Valinda Hoar, NP ?04/14/22 1703 ? ?

## 2022-05-16 IMAGING — US US OB FOLLOW-UP
1 series · 13 of 28 positions shown · non-contrast
Comparison: 04/12/2021

CLINICAL DATA: Evaluate fetal growth

EXAM:
OBSTETRICAL ULTRASOUND >14 WKS

[Series 1: ob us · 13 of 45 slices shown]
[im 2/45]
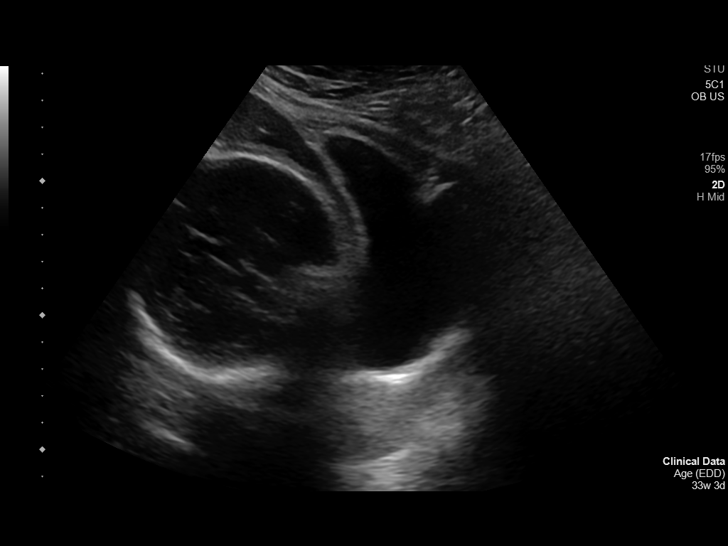
[im 5/45]
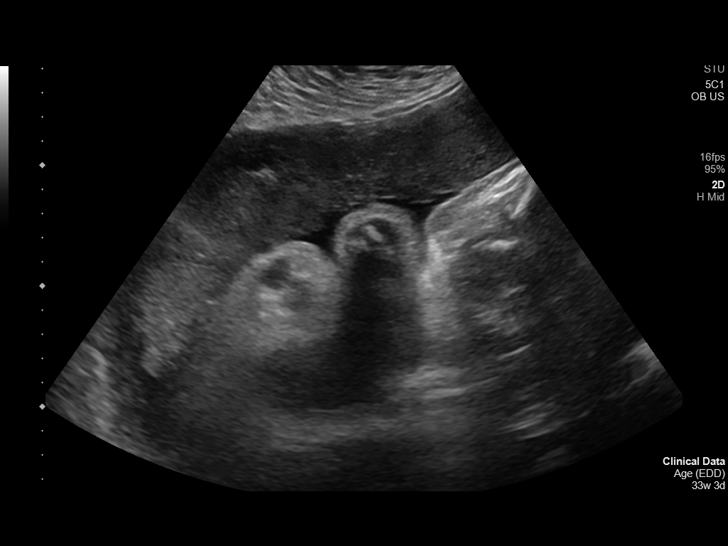
[im 9/45]
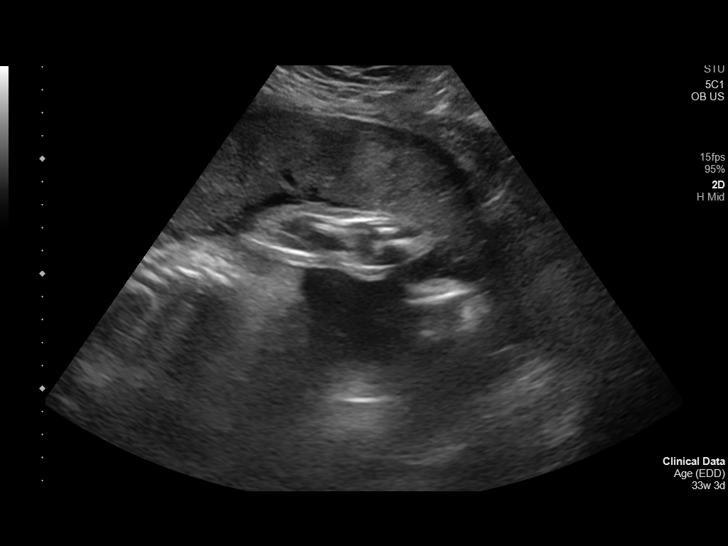
[im 12/45]
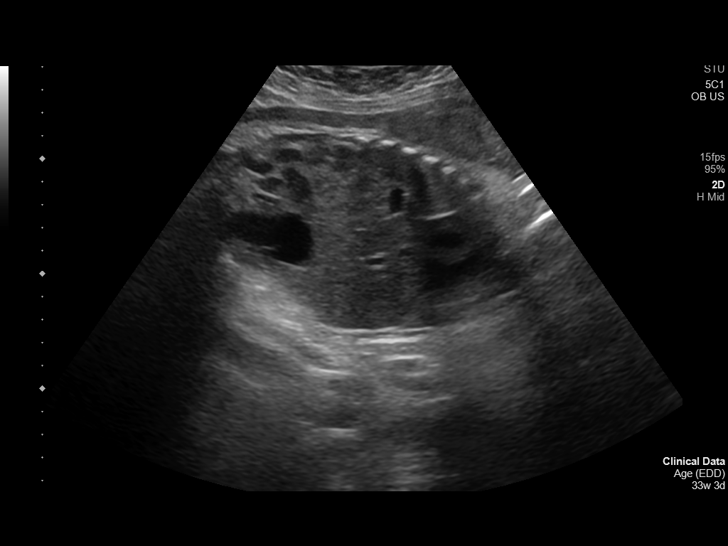
[im 15/45]
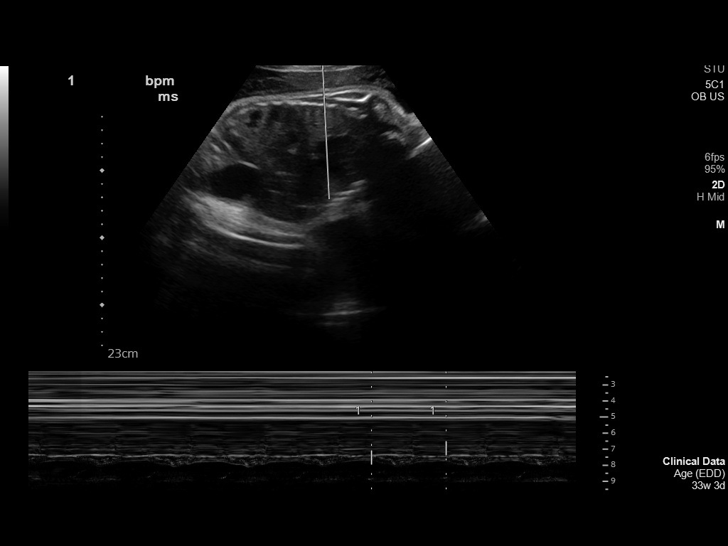
[im 18/45]
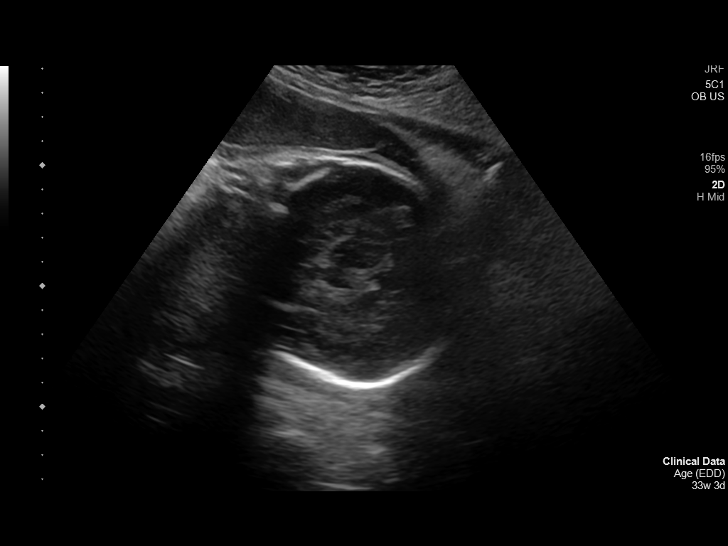
[im 23/45]
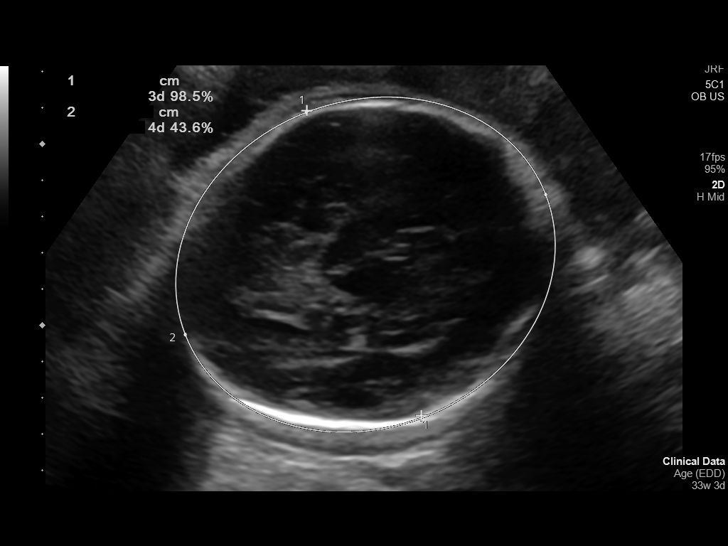
[im 27/45]
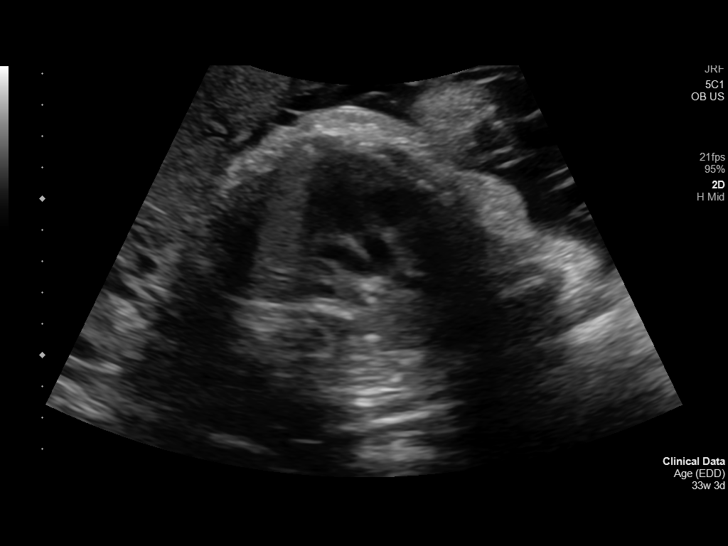
[im 30/45]
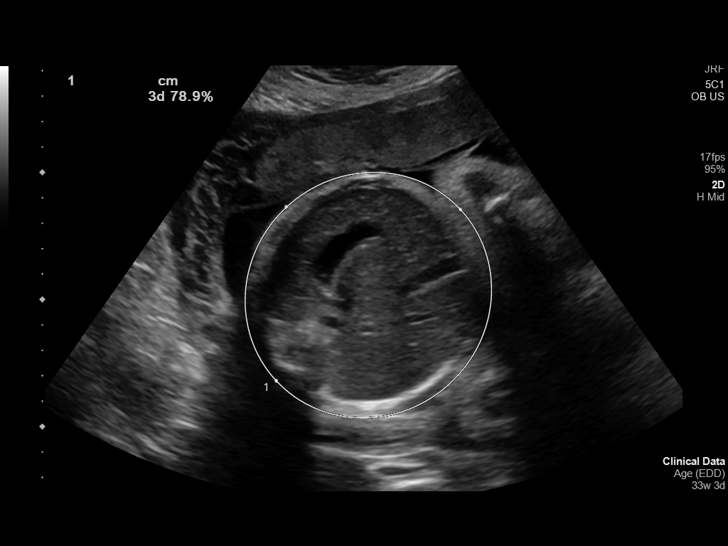
[im 33/45]
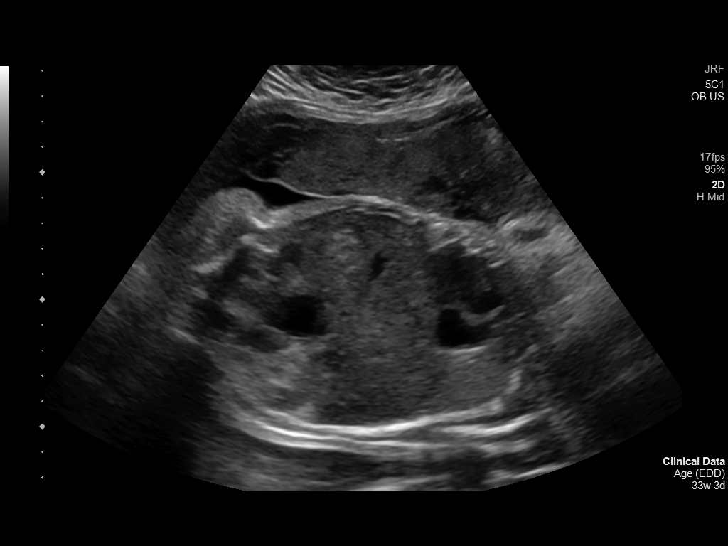
[im 36/45]
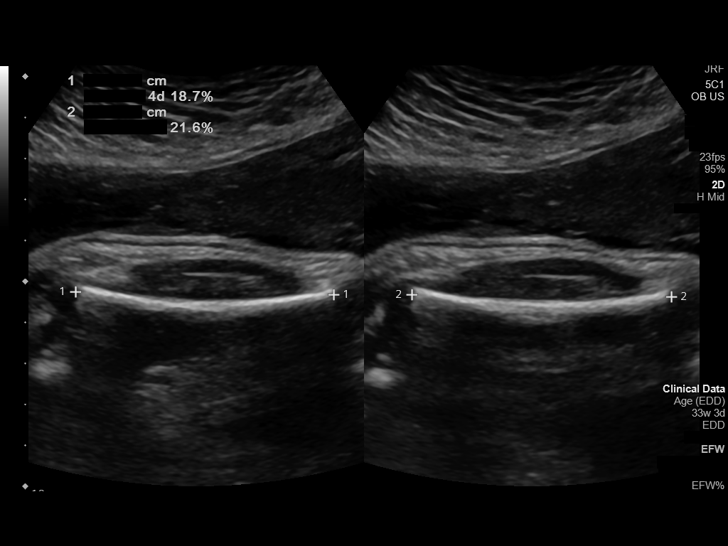
[im 40/45]
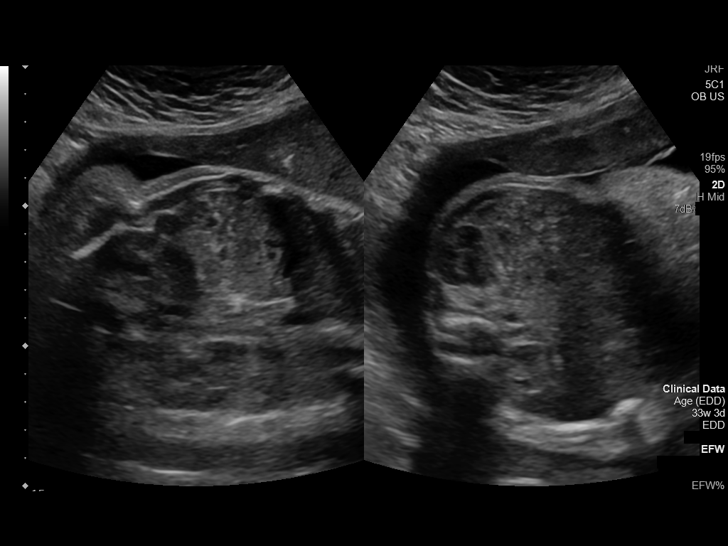
[im 43/45]
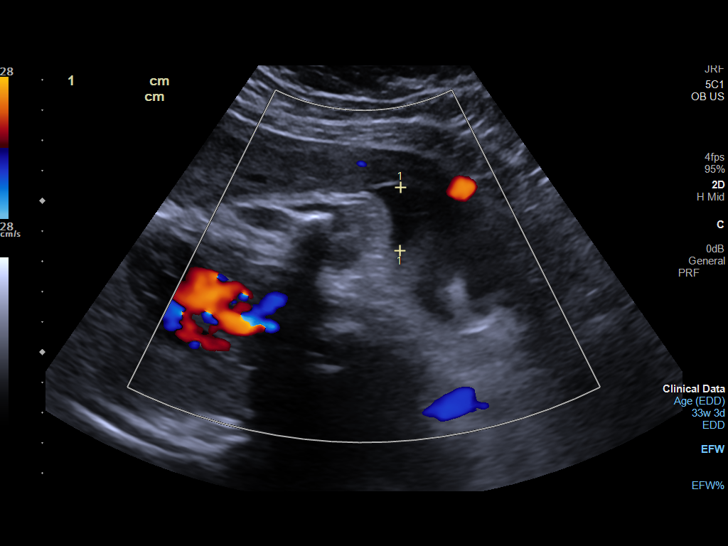

[13 of 28 positions shown; findings below may reference images not displayed]

FINDINGS: Number of Fetuses: 1

Heart Rate:  137 bpm

Movement: 9 yes

Presentation: Cephalic

Previa: Neck no

Placental Location: Anterior

Amniotic Fluid (Subjective): Normal

Amniotic Fluid (Objective):

AFI = 10.3 cm (5%ile= 8.3 cm, 95%= 24.5 cm for 33 wks)

FETAL BIOMETRY

BPD: 8.91cm 36w 0d

HC: 31.14cm  34w   6d

AC: 30.49cm  34w   3d

FL: 6.32cm  32w   5d

Current Mean GA: 34w 2d                     US EDC: 08/24/2021

Assigned GA:  33w 3d            Assigned EDC: 08/30/2021

Estimated Fetal Weight:  2361g    64%ile

FETAL ANATOMY

Lateral Ventricles: Appears normal

Thalami/CSP: Appears normal

Posterior Fossa:  Previously seen

Nuchal Region: Previously seen   NFT= N/A > 20 WKS

Upper Lip: Previously seen

Spine: Previously seen

4 Chamber Heart on Left: Appears normal

LVOT: Appears normal

RVOT: Previously seen

Stomach on Left: Appears normal

3 Vessel Cord: Previously seen

Cord Insertion site: Previously seen

Kidneys: Appears normal

Bladder: Appears normal

Extremities: Previously seen

Technically difficult due to: Advanced gestational age and fetal
position

Maternal Findings:

Cervix:  Cervical length 3.9 cm.  Closed.
IMPRESSION: Single live intrauterine pregnancy as detailed above.

## 2022-10-28 ENCOUNTER — Other Ambulatory Visit: Payer: Self-pay | Admitting: Obstetrics & Gynecology

## 2022-10-28 ENCOUNTER — Other Ambulatory Visit: Payer: Self-pay

## 2022-10-28 DIAGNOSIS — Z3009 Encounter for other general counseling and advice on contraception: Secondary | ICD-10-CM

## 2022-10-28 MED ORDER — NORETHINDRONE 0.35 MG PO TABS: 1.0000 | ORAL_TABLET | Freq: Every day | ORAL | 1 refills | Status: DC

## 2022-11-28 ENCOUNTER — Other Ambulatory Visit: Payer: Self-pay | Admitting: Obstetrics & Gynecology

## 2022-11-28 DIAGNOSIS — Z3009 Encounter for other general counseling and advice on contraception: Secondary | ICD-10-CM

## 2022-11-29 ENCOUNTER — Other Ambulatory Visit (HOSPITAL_COMMUNITY)
Admission: RE | Admit: 2022-11-29 | Discharge: 2022-11-29 | Disposition: A | Discharge status: Home / Self Care | Payer: 59 | Source: Ambulatory Visit | Attending: Obstetrics & Gynecology | Admitting: Obstetrics & Gynecology

## 2022-11-29 ENCOUNTER — Ambulatory Visit (INDEPENDENT_AMBULATORY_CARE_PROVIDER_SITE_OTHER): Payer: Managed Care, Other (non HMO) | Admitting: Obstetrics & Gynecology

## 2022-11-29 VITALS — BP 132/83 | HR 69 | Ht 67.0 in | Wt 242.3 lb

## 2022-11-29 DIAGNOSIS — Z124 Encounter for screening for malignant neoplasm of cervix: Secondary | ICD-10-CM | POA: Diagnosis present

## 2022-11-29 DIAGNOSIS — Z01419 Encounter for gynecological examination (general) (routine) without abnormal findings: Secondary | ICD-10-CM | POA: Diagnosis present

## 2022-11-29 DIAGNOSIS — Z8632 Personal history of gestational diabetes: Secondary | ICD-10-CM

## 2022-11-29 DIAGNOSIS — E669 Obesity, unspecified: Secondary | ICD-10-CM

## 2022-11-29 MED ORDER — LEVONORGESTREL-ETHINYL ESTRAD 0.1-20 MG-MCG PO TABS: 1.0000 | ORAL_TABLET | Freq: Every day | ORAL | 5 refills | Status: DC

## 2022-11-29 NOTE — Progress Notes (Signed)
Subjective:    Monique Reyes is a 26 y.o. married P52 (52 yo daughter) who presents for an annual exam. The patient has no complaints today. The patient is sexually active. She uses POPs and withdrawal. GYN screening history: ASCUS pap last year.  The patient wears seatbelts: yes. The patient participates in regular exercise: no. Has the patient ever been transfused or tattooed?: no. The patient reports that there is not domestic violence in her life.  She is considering another pregnancy in about 2 years. She is no longer breastfeeding. Menstrual History: OB History     Gravida  1   Para  1   Term  1   Preterm  0   AB  0   Living  1      SAB  0   IAB  0   Ectopic  0   Multiple  0   Live Births  1          Patient's last menstrual period was 11/29/2022 (exact date).    The following portions of the patient's history were reviewed and updated as appropriate: allergies, current medications, past family history, past medical history, past social history, past surgical history, and problem list.  Review of Systems Pertinent items are noted in HPI.  FH- no breast, gyn, colon cancers    Objective:    BP 132/83   Pulse 69   Ht 5\' 7"  (1.702 m)   Wt 242 lb 4.8 oz (109.9 kg)   LMP 11/29/2022 (Exact Date)   Breastfeeding No   BMI 37.95 kg/m   General Appearance:    Alert, cooperative, no distress, appears stated age  Head:    Normocephalic, without obvious abnormality, atraumatic  Eyes:    PERRL, conjunctiva/corneas clear, EOM's intact, fundi    benign, both eyes  Ears:    Normal TM's and external ear canals, both ears  Nose:   Nares normal, septum midline, mucosa normal, no drainage    or sinus tenderness  Throat:   Lips, mucosa, and tongue normal; teeth and gums normal  Neck:   Supple, symmetrical, trachea midline, no adenopathy;    thyroid:  no enlargement/tenderness/nodules; no carotid   bruit or JVD  Back:     Symmetric, no curvature, ROM normal, no CVA  tenderness  Lungs:     Clear to auscultation bilaterally, respirations unlabored  Chest Wall:    No tenderness or deformity   Heart:    Regular rate and rhythm, S1 and S2 normal, no murmur, rub   or gallop  Breast Exam:    No tenderness, masses, or nipple abnormality  Abdomen:     Soft, non-tender, bowel sounds active all four quadrants,    no masses, no organomegaly  Genitalia:    Normal female without lesion, discharge or tenderness,  Small amount of bleeding noted. bimanual exam did not reveal any pelvic masses or tenderness     Extremities:   Extremities normal, atraumatic, no cyanosis or edema  Pulses:   2+ and symmetric all extremities  Skin:   Skin color, texture, turgor normal, no rashes or lesions  Lymph nodes:   Cervical, supraclavicular, and axillary nodes normal  Neurologic:   CNII-XII intact, normal strength, sensation and reflexes    throughout  .    Assessment:    Healthy female exam.  H/o GDM- schedule 2 hour GTT Contraception- change POP to the OCP that she used prior to pregnancy ASCUS pap last year- repeat pap today  Plan:     Thin prep Pap smear.  As above

## 2022-12-02 LAB — CYTOLOGY - PAP
Chlamydia: NEGATIVE
Comment: NEGATIVE
Comment: NORMAL
Diagnosis: UNDETERMINED — AB
Neisseria Gonorrhea: NEGATIVE

## 2022-12-05 ENCOUNTER — Encounter: Payer: Self-pay | Admitting: Obstetrics & Gynecology

## 2022-12-07 ENCOUNTER — Other Ambulatory Visit: Payer: 59

## 2022-12-07 DIAGNOSIS — Z8632 Personal history of gestational diabetes: Secondary | ICD-10-CM

## 2022-12-08 LAB — GLUCOSE TOLERANCE, 2 HOURS W/ 1HR
Glucose, 1 hour: 227 mg/dL — ABNORMAL HIGH (ref 70–179)
Glucose, 2 hour: 194 mg/dL — ABNORMAL HIGH (ref 70–152)
Glucose, Fasting: 86 mg/dL (ref 70–91)

## 2022-12-12 ENCOUNTER — Encounter: Payer: Self-pay | Admitting: Obstetrics & Gynecology

## 2022-12-25 ENCOUNTER — Other Ambulatory Visit: Payer: Self-pay | Admitting: Obstetrics & Gynecology

## 2022-12-25 DIAGNOSIS — Z3009 Encounter for other general counseling and advice on contraception: Secondary | ICD-10-CM

## 2022-12-28 ENCOUNTER — Other Ambulatory Visit: Payer: Self-pay

## 2022-12-30 ENCOUNTER — Ambulatory Visit
Admission: EM | Admit: 2022-12-30 | Discharge: 2022-12-30 | Disposition: A | Discharge status: Home / Self Care | Payer: Managed Care, Other (non HMO) | Attending: Physician Assistant | Admitting: Physician Assistant

## 2022-12-30 ENCOUNTER — Encounter: Payer: Self-pay | Admitting: Emergency Medicine

## 2022-12-30 DIAGNOSIS — J029 Acute pharyngitis, unspecified: Secondary | ICD-10-CM | POA: Insufficient documentation

## 2022-12-30 DIAGNOSIS — R058 Other specified cough: Secondary | ICD-10-CM | POA: Insufficient documentation

## 2022-12-30 DIAGNOSIS — Z1152 Encounter for screening for COVID-19: Secondary | ICD-10-CM | POA: Insufficient documentation

## 2022-12-30 DIAGNOSIS — J069 Acute upper respiratory infection, unspecified: Secondary | ICD-10-CM | POA: Diagnosis not present

## 2022-12-30 DIAGNOSIS — Z20828 Contact with and (suspected) exposure to other viral communicable diseases: Secondary | ICD-10-CM | POA: Diagnosis not present

## 2022-12-30 LAB — SARS CORONAVIRUS 2 BY RT PCR: SARS Coronavirus 2 by RT PCR: NEGATIVE

## 2022-12-30 LAB — RAPID INFLUENZA A&B ANTIGENS
Influenza A (ARMC): NEGATIVE
Influenza B (ARMC): NEGATIVE

## 2022-12-30 NOTE — Discharge Instructions (Signed)
Your symptoms today are most likely being caused by a virus and should steadily improve in time it can take up to 7 to 10 days before you truly start to see a turnaround however things will get better  COVID and flu testing negative    You can take Tylenol and/or Ibuprofen as needed for fever reduction and pain relief.   For cough: honey 1/2 to 1 teaspoon (you can dilute the honey in water or another fluid).  You can also use guaifenesin and dextromethorphan for cough. You can use a humidifier for chest congestion and cough.  If you don't have a humidifier, you can sit in the bathroom with the hot shower running.      For sore throat: try warm salt water gargles, cepacol lozenges, throat spray, warm tea or water with lemon/honey, popsicles or ice, or OTC cold relief medicine for throat discomfort.   For congestion: take a daily anti-histamine like Zyrtec, Claritin, and a oral decongestant, such as pseudoephedrine.  You can also use Flonase 1-2 sprays in each nostril daily.   It is important to stay hydrated: drink plenty of fluids (water, gatorade/powerade/pedialyte, juices, or teas) to keep your throat moisturized and help further relieve irritation/discomfort.  

## 2022-12-30 NOTE — ED Provider Notes (Signed)
[facility]    CSN: [id number] Arrival date & time: 12/30/22  6295      History   Chief Complaint Chief Complaint  Patient presents with   Cough    Flu exposure   Sore Throat    HPI Monique Reyes is a 27 y.o. female.   Patient presents for evaluation of sore throat and a nonproductive cough present for 2 days.  Tolerating food and liquids.  Known sick contact with exposure to influenza.  Has attempted use of Zicam which has been minimally helpful.  Denies shortness of breath, wheezing, fever, chills or bodyaches.    Past Medical History:  Diagnosis Date   Encounter for supervision of normal first pregnancy in second trimester 01/19/2021   [facility] Prenatal Labs Dating By 9w u/s Blood type: A/Positive/-- (01/25 0935)  Genetic Screen  declined Antibody:Negative (01/25 0935) Anatomic [place] Normal female Addison Rubella: 1.07 (01/25 0935)  Varicella: non-immune GTT Early:  57              Third trimester: 151  3hr: all 4 abn RPR: Non Reactive (01/25 0935)  Rhogam Not needed  HBsAg: Negative (01/25 0935)  Vaccines TDAP:    6/29/202   Gestational diabetes    Migraine     There are no problems to display for this patient.   Past Surgical History:  Procedure Laterality Date   ADENOIDECTOMY     TYMPANOSTOMY TUBE PLACEMENT     TYMPANOSTOMY TUBE PLACEMENT      OB History     Gravida  1   Para  1   Term  1   Preterm  0   AB  0   Living  1      SAB  0   IAB  0   Ectopic  0   Multiple  0   Live Births  1            Home Medications    Prior to Admission medications   Medication Sig Start Date End Date Taking? Authorizing Provider  levonorgestrel-ethinyl estradiol (ALESSE) 0.1-20 MG-MCG tablet Take 1 tablet by mouth daily. 11/29/22  Yes Dove, Myra C, MD  Prenatal Vit-Fe Fumarate-FA (MULTIVITAMIN-PRENATAL) 27-0.8 MG TABS tablet Take 1 tablet by mouth daily at 12 noon.   Yes [provider]  Ascorbic Acid (VITAMIN C PO) Take 1  tablet by mouth daily.    [provider]  lidocaine (XYLOCAINE) 2 % solution Use as directed 15 mLs in the mouth or throat as needed. Patient not taking: Reported on 11/29/2022 04/14/22   Hans Eden, NP    Family History Family History  Problem Relation Age of Onset   Healthy Mother    Other Father        unknown medical history   Diabetes Maternal Grandfather    Ovarian cancer Other    Colon cancer Other     Social History Social History   Tobacco Use   Smoking status: Never   Smokeless tobacco: Former   Tobacco comments:    Vaping - not since pregnant  Vaping Use   Vaping Use: Never used  Substance Use Topics   Alcohol use: Not Currently    Comment: social   Drug use: No     Allergies   Patient has no known allergies.   Review of Systems Review of Systems  Constitutional: Negative.   HENT:  Positive for sore throat. Negative for congestion, dental problem, drooling, ear discharge,  ear pain, facial swelling, hearing loss, mouth sores, nosebleeds, postnasal drip, rhinorrhea, sinus pressure, sinus pain, sneezing, tinnitus, trouble swallowing and voice change.   Respiratory:  Positive for cough. Negative for apnea, choking, chest tightness, shortness of breath, wheezing and stridor.   Cardiovascular: Negative.   Gastrointestinal: Negative.   Skin: Negative.   Neurological: Negative.      Physical Exam Triage Vital Signs ED Triage Vitals  Enc Vitals Group     BP 12/30/22 0934 100/69     Pulse Rate 12/30/22 0934 72     Resp 12/30/22 0934 14     Temp 12/30/22 0934 98.5 F (36.9 C)     Temp Source 12/30/22 0934 Oral     SpO2 12/30/22 0934 96 %     Weight 12/30/22 0933 242 lb (109.8 kg)     Height 12/30/22 0933 5\' 7"  (1.702 m)     Head Circumference --      Peak Flow --      Pain Score 12/30/22 0932 0     Pain Loc --      Pain Edu? --      Excl. in [place]? --    No data found.  Updated Vital Signs BP 100/69 (BP Location: Right Arm)   Pulse  72   Temp 98.5 F (36.9 C) (Oral)   Resp 14   Ht 5\' 7"  (1.702 m)   Wt 242 lb (109.8 kg)   LMP 12/28/2022 (Exact Date)   SpO2 96%   Breastfeeding No   BMI 37.90 kg/m   Visual Acuity Right Eye Distance:   Left Eye Distance:   Bilateral Distance:    Right Eye Near:   Left Eye Near:    Bilateral Near:     Physical Exam Constitutional:      Appearance: Normal appearance.  HENT:     Head: Normocephalic.     Right Ear: Tympanic membrane, ear canal and external ear normal.     Left Ear: Tympanic membrane, ear canal and external ear normal.     Nose: Nose normal.     Mouth/Throat:     Mouth: Mucous membranes are moist.     Pharynx: Posterior oropharyngeal erythema present.     Tonsils: No tonsillar exudate. 0 on the right. 0 on the left.  Eyes:     Extraocular Movements: Extraocular movements intact.  Cardiovascular:     Rate and Rhythm: Normal rate and regular rhythm.     Pulses: Normal pulses.     Heart sounds: Normal heart sounds.  Pulmonary:     Effort: Pulmonary effort is normal.     Breath sounds: Normal breath sounds.  Musculoskeletal:        General: Normal range of motion.  Skin:    General: Skin is warm and dry.  Neurological:     Mental Status: She is alert and oriented to person, place, and time. Mental status is at baseline.      UC Treatments / Results  Labs (all labs ordered are listed, but only abnormal results are displayed) Labs Reviewed  SARS CORONAVIRUS 2 BY RT PCR    EKG   Radiology No results found.  Procedures Procedures (including critical care time)  Medications Ordered in UC Medications - No data to display  Initial Impression / Assessment and Plan / UC Course  I have reviewed the triage vital signs and the nursing notes.  Pertinent labs & imaging results that were available during my care of the patient  were reviewed by me and considered in my medical decision making (see chart for details).  Viral URI with cough  Patient  is in no signs of distress nor toxic appearing.  Vital signs are stable.  Low suspicion for pneumonia, pneumothorax or bronchitis and therefore will defer imaging.  Flu and COVID testing negative. May use additional over-the-counter medications as needed for supportive care.  May follow-up with urgent care as needed if symptoms persist or worsen. Note given.   Final Clinical Impressions(s) / UC Diagnoses   Final diagnoses:  None   Discharge Instructions   None    ED Prescriptions   None    PDMP not reviewed this encounter.   Hans Eden, NP 12/30/22 1030

## 2023-02-10 ENCOUNTER — Ambulatory Visit
Admission: EM | Admit: 2023-02-10 | Discharge: 2023-02-10 | Disposition: A | Discharge status: Home / Self Care | Payer: Managed Care, Other (non HMO) | Attending: Physician Assistant | Admitting: Physician Assistant

## 2023-02-10 ENCOUNTER — Encounter: Payer: Self-pay | Admitting: Emergency Medicine

## 2023-02-10 DIAGNOSIS — N3000 Acute cystitis without hematuria: Secondary | ICD-10-CM | POA: Diagnosis present

## 2023-02-10 DIAGNOSIS — R300 Dysuria: Secondary | ICD-10-CM | POA: Insufficient documentation

## 2023-02-10 LAB — URINALYSIS, W/ REFLEX TO CULTURE (INFECTION SUSPECTED)
Glucose, UA: 100 mg/dL — AB
Nitrite: POSITIVE — AB
Protein, ur: 30 mg/dL — AB
Specific Gravity, Urine: 1.025 (ref 1.005–1.030)
pH: 6 (ref 5.0–8.0)

## 2023-02-10 MED ORDER — NITROFURANTOIN MONOHYD MACRO 100 MG PO CAPS: 100.0000 mg | ORAL_CAPSULE | Freq: Two times a day (BID) | ORAL | 0 refills | Status: DC

## 2023-02-10 NOTE — ED Provider Notes (Signed)
[facility]    CSN: [id number] Arrival date & time: 02/10/23  1120      History   Chief Complaint Chief Complaint  Patient presents with   Dysuria    HPI Monique Reyes is a 27 y.o. female presenting for dysuria, urinary frequency/urgency x 5 days. Denies fever, fatigue, chills, hematuria, abdominal pain, flank pain, n/v, vaginal discharge and no report of concern for STIs. Has tried AZO for symptoms. No other concerns.  HPI  Past Medical History:  Diagnosis Date   Encounter for supervision of normal first pregnancy in second trimester 01/19/2021   [facility] Prenatal Labs Dating By 9w u/s Blood type: A/Positive/-- (01/25 0935)  Genetic Screen  declined Antibody:Negative (01/25 0935) Anatomic [place] Normal female Addison Rubella: 1.07 (01/25 0935)  Varicella: non-immune GTT Early:  59              Third trimester: 151  3hr: all 4 abn RPR: Non Reactive (01/25 0935)  Rhogam Not needed  HBsAg: Negative (01/25 0935)  Vaccines TDAP:    6/29/202   Gestational diabetes    Migraine     There are no problems to display for this patient.   Past Surgical History:  Procedure Laterality Date   ADENOIDECTOMY     TYMPANOSTOMY TUBE PLACEMENT     TYMPANOSTOMY TUBE PLACEMENT      OB History     Gravida  1   Para  1   Term  1   Preterm  0   AB  0   Living  1      SAB  0   IAB  0   Ectopic  0   Multiple  0   Live Births  1            Home Medications    Prior to Admission medications   Medication Sig Start Date End Date Taking? Authorizing Provider  levonorgestrel-ethinyl estradiol (ALESSE) 0.1-20 MG-MCG tablet Take 1 tablet by mouth daily. 11/29/22  Yes Dove, Myra C, MD  nitrofurantoin, macrocrystal-monohydrate, (MACROBID) 100 MG capsule Take 1 capsule (100 mg total) by mouth 2 (two) times daily. 02/10/23  Yes Danton Clap, PA-C  Ascorbic Acid (VITAMIN C PO) Take 1 tablet by mouth daily.    [provider]  lidocaine (XYLOCAINE) 2 %  solution Use as directed 15 mLs in the mouth or throat as needed. Patient not taking: Reported on 11/29/2022 04/14/22   Hans Eden, NP  Prenatal Vit-Fe Fumarate-FA (MULTIVITAMIN-PRENATAL) 27-0.8 MG TABS tablet Take 1 tablet by mouth daily at 12 noon.    [provider]    Family History Family History  Problem Relation Age of Onset   Healthy Mother    Other Father        unknown medical history   Diabetes Maternal Grandfather    Ovarian cancer Other    Colon cancer Other     Social History Social History   Tobacco Use   Smoking status: Never   Smokeless tobacco: Former   Tobacco comments:    Vaping - not since pregnant  Vaping Use   Vaping Use: Every day  Substance Use Topics   Alcohol use: Not Currently    Comment: social   Drug use: No     Allergies   Patient has no known allergies.   Review of Systems Review of Systems  Constitutional:  Negative for chills, fatigue and fever.  Gastrointestinal:  Negative for abdominal pain, diarrhea, nausea  and vomiting.  Genitourinary:  Positive for dysuria, frequency and urgency. Negative for decreased urine volume, flank pain, hematuria, pelvic pain, vaginal bleeding, vaginal discharge and vaginal pain.  Musculoskeletal:  Negative for back pain.  Skin:  Negative for rash.     Physical Exam Triage Vital Signs ED Triage Vitals  Enc Vitals Group     BP 02/10/23 1134 124/82     Pulse Rate 02/10/23 1134 90     Resp 02/10/23 1134 14     Temp 02/10/23 1134 98.4 F (36.9 C)     Temp Source 02/10/23 1134 Oral     SpO2 02/10/23 1134 94 %     Weight 02/10/23 1132 220 lb (99.8 kg)     Height 02/10/23 1132 5' 7"$  (1.702 m)     Head Circumference --      Peak Flow --      Pain Score 02/10/23 1132 5     Pain Loc --      Pain Edu? --      Excl. in [place]? --    No data found.  Updated Vital Signs BP 124/82 (BP Location: Right Arm)   Pulse 90   Temp 98.4 F (36.9 C) (Oral)   Resp 14   Ht 5' 7"$  (1.702 m)   Wt  220 lb (99.8 kg)   LMP 01/27/2023   SpO2 94%   Breastfeeding No   BMI 34.46 kg/m   Physical Exam Vitals and nursing note reviewed.  Constitutional:      General: She is not in acute distress.    Appearance: Normal appearance. She is not ill-appearing or toxic-appearing.  HENT:     Head: Normocephalic and atraumatic.  Eyes:     General: No scleral icterus.       Right eye: No discharge.        Left eye: No discharge.     Conjunctiva/sclera: Conjunctivae normal.  Cardiovascular:     Rate and Rhythm: Normal rate and regular rhythm.     Heart sounds: Normal heart sounds.  Pulmonary:     Effort: Pulmonary effort is normal. No respiratory distress.     Breath sounds: Normal breath sounds.  Abdominal:     Palpations: Abdomen is soft.     Tenderness: There is abdominal tenderness (suprapubic). There is no right CVA tenderness or left CVA tenderness.  Musculoskeletal:     Cervical back: Neck supple.  Skin:    General: Skin is dry.  Neurological:     General: No focal deficit present.     Mental Status: She is alert. Mental status is at baseline.     Motor: No weakness.     Gait: Gait normal.  Psychiatric:        Mood and Affect: Mood normal.        Behavior: Behavior normal.        Thought Content: Thought content normal.      UC Treatments / Results  Labs (all labs ordered are listed, but only abnormal results are displayed) Labs Reviewed  URINALYSIS, W/ REFLEX TO CULTURE (INFECTION SUSPECTED) - Abnormal; Notable for the following components:      Result Value   Glucose, UA 100 (*)    Hgb urine dipstick MODERATE (*)    Bilirubin Urine SMALL (*)    Ketones, ur TRACE (*)    Protein, ur 30 (*)    Nitrite POSITIVE (*)    Leukocytes,Ua TRACE (*)    Bacteria, UA MANY (*)  All other components within normal limits  URINE CULTURE    EKG   Radiology No results found.  Procedures Procedures (including critical care time)  Medications Ordered in UC Medications  - No data to display  Initial Impression / Assessment and Plan / UC Course  I have reviewed the triage vital signs and the nursing notes.  Pertinent labs & imaging results that were available during my care of the patient were reviewed by me and considered in my medical decision making (see chart for details).   27 y/o female presents for 5 day history of dysuria, urgency/frequency. Denies fever, flank pain, abdominal pain and no vaginal complaints.   VSS and patient is overall well appearing. No abdominal or CVA tenderness.   UA obtained.  UA shows glucose, hemoglobin, bili, ketones, protein, positive nitrites, trace leukocytes and many bacteria.  Urine to be sent for culture.  Reviewed results of UA with patient.  Positive for UTI.  Will treat with Macrobid. Advised increasing rest and fluids. Reviewed return and ED precautions.    Final Clinical Impressions(s) / UC Diagnoses   Final diagnoses:  Acute cystitis without hematuria  Dysuria     Discharge Instructions      UTI: Based on either symptoms or urinalysis, you may have a urinary tract infection. We will send the urine for culture and call with results in a few days. Begin antibiotics at this time. Your symptoms should be much improved over the next 2-3 days. Increase rest and fluid intake. If for some reason symptoms are worsening or not improving after a couple of days or the urine culture determines the antibiotics you are taking will not treat the infection, the antibiotics may be changed. Return or go to ER for fever, back pain, worsening urinary pain, discharge, increased blood in urine. May take Tylenol or Motrin OTC for pain relief or consider AZO if no contraindications      ED Prescriptions     Medication Sig Dispense Auth. Provider   nitrofurantoin, macrocrystal-monohydrate, (MACROBID) 100 MG capsule Take 1 capsule (100 mg total) by mouth 2 (two) times daily. 10 capsule Danton Clap, PA-C      PDMP not  reviewed this encounter.   Danton Clap, PA-C 02/10/23 1226

## 2023-02-10 NOTE — ED Triage Notes (Signed)
Patient reports burning when urinating and urinary frequency that started on Sunday.  Patient denies vaginal discharge or irritation.

## 2023-02-10 NOTE — Discharge Instructions (Signed)

## 2023-02-13 LAB — URINE CULTURE: Culture: >=100000 — AB

## 2023-02-20 ENCOUNTER — Ambulatory Visit
Admission: EM | Admit: 2023-02-20 | Discharge: 2023-02-20 | Disposition: A | Discharge status: Home / Self Care | Payer: Managed Care, Other (non HMO) | Attending: Physician Assistant | Admitting: Physician Assistant

## 2023-02-20 DIAGNOSIS — Z1152 Encounter for screening for COVID-19: Secondary | ICD-10-CM | POA: Insufficient documentation

## 2023-02-20 DIAGNOSIS — R051 Acute cough: Secondary | ICD-10-CM

## 2023-02-20 DIAGNOSIS — J029 Acute pharyngitis, unspecified: Secondary | ICD-10-CM | POA: Diagnosis not present

## 2023-02-20 DIAGNOSIS — J069 Acute upper respiratory infection, unspecified: Secondary | ICD-10-CM

## 2023-02-20 LAB — GROUP A STREP BY PCR: Group A Strep by PCR: NOT DETECTED

## 2023-02-20 LAB — SARS CORONAVIRUS 2 BY RT PCR: SARS Coronavirus 2 by RT PCR: NEGATIVE

## 2023-02-20 NOTE — ED Provider Notes (Signed)
[facility]    CSN: [id number] Arrival date & time: 02/20/23  1346      History   Chief Complaint Chief Complaint  Patient presents with   Sore Throat    HPI Monique Reyes is a 27 y.o. female onset of sore throat, cough and nausea this morning.  Denies fever, chills, body aches, congestion, vomiting, diarrhea, breathing difficulty.  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  Unsure if she has been around any sick children.  Has not taken any medication for her symptoms.  Otherwise healthy.  No other complaints.  HPI  Past Medical History:  Diagnosis Date   Encounter for supervision of normal first pregnancy in second trimester 01/19/2021   [facility] Prenatal Labs Dating By 9w u/s Blood type: A/Positive/-- (01/25 0935)  Genetic Screen  declined Antibody:Negative (01/25 0935) Anatomic [place] Normal female Addison Rubella: 1.07 (01/25 0935)  Varicella: non-immune GTT Early:  34              Third trimester: 151  3hr: all 4 abn RPR: Non Reactive (01/25 0935)  Rhogam Not needed  HBsAg: Negative (01/25 0935)  Vaccines TDAP:    6/29/202   Gestational diabetes    Migraine     There are no problems to display for this patient.   Past Surgical History:  Procedure Laterality Date   ADENOIDECTOMY     TYMPANOSTOMY TUBE PLACEMENT     TYMPANOSTOMY TUBE PLACEMENT      OB History     Gravida  1   Para  1   Term  1   Preterm  0   AB  0   Living  1      SAB  0   IAB  0   Ectopic  0   Multiple  0   Live Births  1            Home Medications    Prior to Admission medications   Medication Sig Start Date End Date Taking? Authorizing Provider  levonorgestrel-ethinyl estradiol (ALESSE) 0.1-20 MG-MCG tablet Take 1 tablet by mouth daily. 11/29/22  Yes Dove, Myra C, MD  levonorgestrel-ethinyl estradiol (ORSYTHIA) 0.1-20 MG-MCG tablet Take 1 tablet by mouth daily. 01/03/16  Yes [provider]  Ascorbic Acid (VITAMIN C PO) Take 1 tablet by mouth  daily.    [provider]  cetirizine (ZYRTEC) 10 MG tablet Take by mouth.    [provider]  lidocaine (XYLOCAINE) 2 % solution Use as directed 15 mLs in the mouth or throat as needed. Patient not taking: Reported on 11/29/2022 04/14/22   Hans Eden, NP  nitrofurantoin, macrocrystal-monohydrate, (MACROBID) 100 MG capsule Take 1 capsule (100 mg total) by mouth 2 (two) times daily. 02/10/23   Danton Clap, PA-C  Prenatal Vit-Fe Fumarate-FA (MULTIVITAMIN-PRENATAL) 27-0.8 MG TABS tablet Take 1 tablet by mouth daily at 12 noon.    [provider]    Family History Family History  Problem Relation Age of Onset   Healthy Mother    Other Father        unknown medical history   Diabetes Maternal Grandfather    Ovarian cancer Other    Colon cancer Other     Social History Social History   Tobacco Use   Smoking status: Never   Smokeless tobacco: Former   Tobacco comments:    Vaping - not since pregnant  Vaping Use   Vaping Use: Every day  Substance Use Topics  Alcohol use: Not Currently    Comment: social   Drug use: No     Allergies   Patient has no known allergies.   Review of Systems Review of Systems  Constitutional:  Negative for chills, diaphoresis, fatigue and fever.  HENT:  Positive for sore throat. Negative for congestion, ear pain, rhinorrhea, sinus pressure and sinus pain.   Respiratory:  Positive for cough. Negative for shortness of breath.   Gastrointestinal:  Positive for nausea. Negative for abdominal pain and vomiting.  Musculoskeletal:  Negative for arthralgias and myalgias.  Skin:  Negative for rash.  Neurological:  Negative for weakness and headaches.  Hematological:  Negative for adenopathy.     Physical Exam Triage Vital Signs ED Triage Vitals  Enc Vitals Group     BP      Pulse      Resp      Temp      Temp src      SpO2      Weight      Height      Head Circumference      Peak Flow      Pain Score       Pain Loc      Pain Edu?      Excl. in [place]?    No data found.  Updated Vital Signs BP 119/74 (BP Location: Right Arm)   Pulse 71   Temp 98.9 F (37.2 C) (Oral)   Resp 18   LMP 01/27/2023   SpO2 98%     Physical Exam Vitals and nursing note reviewed.  Constitutional:      General: She is not in acute distress.    Appearance: Normal appearance. She is not ill-appearing or toxic-appearing.  HENT:     Head: Normocephalic and atraumatic.     Nose: Nose normal.     Mouth/Throat:     Mouth: Mucous membranes are moist.     Pharynx: Oropharynx is clear. Posterior oropharyngeal erythema present.  Eyes:     General: No scleral icterus.       Right eye: No discharge.        Left eye: No discharge.     Conjunctiva/sclera: Conjunctivae normal.  Cardiovascular:     Rate and Rhythm: Normal rate and regular rhythm.     Heart sounds: Normal heart sounds.  Pulmonary:     Effort: Pulmonary effort is normal. No respiratory distress.     Breath sounds: Normal breath sounds.  Musculoskeletal:     Cervical back: Neck supple.  Skin:    General: Skin is dry.  Neurological:     General: No focal deficit present.     Mental Status: She is alert. Mental status is at baseline.     Motor: No weakness.     Gait: Gait normal.  Psychiatric:        Mood and Affect: Mood normal.        Behavior: Behavior normal.        Thought Content: Thought content normal.      UC Treatments / Results  Labs (all labs ordered are listed, but only abnormal results are displayed) Labs Reviewed  GROUP A STREP BY PCR  SARS CORONAVIRUS 2 BY RT PCR    EKG   Radiology No results found.  Procedures Procedures (including critical care time)  Medications Ordered in UC Medications - No data to display  Initial Impression / Assessment and Plan / UC Course  I have reviewed  the triage vital signs and the nursing notes.  Pertinent labs & imaging results that were available during my care of the patient  were reviewed by me and considered in my medical decision making (see chart for details).   27 year old female presents for sore throat, cough and nausea that began this morning.  Works at a daycare.  Vitals normal and stable and she is overall well-appearing.  On exam has mild posterior pharyngeal erythema, otherwise normal exam.  PCR strep test obtained.  Noted. COVID test obtained.  Negative.  Viral URI.  Supportive care encouraged increasing rest and fluids and OTC meds.  Reviewed she should be feeling better in 7 to 10 days.  Reviewed return precautions.  Work note given.   Final Clinical Impressions(s) / UC Diagnoses   Final diagnoses:  Viral upper respiratory tract infection  Sore throat  Acute cough     Discharge Instructions      -Strep is negative. - We are checking for COVID and I will call you if it is positive.  If positive for COVID you will need to isolate 5 days and wear mask for 5 days.  If negative for COVID then you have another viral infection.  Supportive care with over-the-counter DayQuil/NyQuil or Mucinex, ibuprofen, rest and fluids.  Most colds get better within 7 to 10 days.     ED Prescriptions   None    PDMP not reviewed this encounter.   Danton Clap, PA-C 02/20/23 1623

## 2023-02-20 NOTE — ED Triage Notes (Signed)
Pt reports 1 day history of sore throat. No other symptoms at this time.

## 2023-02-20 NOTE — Discharge Instructions (Addendum)
-  Strep is negative. - We are checking for COVID and I will call you if it is positive.  If positive for COVID you will need to isolate 5 days and wear mask for 5 days.  If negative for COVID then you have another viral infection.  Supportive care with over-the-counter DayQuil/NyQuil or Mucinex, ibuprofen, rest and fluids.  Most colds get better within 7 to 10 days.

## 2023-05-18 ENCOUNTER — Telehealth: Payer: Managed Care, Other (non HMO)

## 2023-05-18 ENCOUNTER — Telehealth: Payer: Managed Care, Other (non HMO) | Admitting: Physician Assistant

## 2023-05-18 DIAGNOSIS — J029 Acute pharyngitis, unspecified: Secondary | ICD-10-CM

## 2023-05-18 NOTE — Patient Instructions (Addendum)
Lesli Albee, thank you for joining Piedad Climes, PA-C for today's virtual visit.  While this provider is not your primary care provider (PCP), if your PCP is located in our provider database this encounter information will be shared with them immediately following your visit.   A [facility] MyChart account gives you access to today's visit and all your visits, tests, and labs performed at [facility] " click here if you don't have a [facility] MyChart account or go to [email or web address]  Consent: (Patient) Lesli Albee provided verbal consent for this virtual visit at the beginning of the encounter.  Current Medications:  Current Outpatient Medications:    Ascorbic Acid (VITAMIN C PO), Take 1 tablet by mouth daily., Disp: , Rfl:    cetirizine (ZYRTEC) 10 MG tablet, Take by mouth., Disp: , Rfl:    levonorgestrel-ethinyl estradiol (ALESSE) 0.1-20 MG-MCG tablet, Take 1 tablet by mouth daily., Disp: 84 tablet, Rfl: 5   Prenatal Vit-Fe Fumarate-FA (MULTIVITAMIN-PRENATAL) 27-0.8 MG TABS tablet, Take 1 tablet by mouth daily at 12 noon., Disp: , Rfl:    Medications ordered in this encounter:  No orders of the defined types were placed in this encounter.    *If you need refills on other medications prior to your next appointment, please contact your pharmacy*  Follow-Up: Call back or seek an in-person evaluation if the symptoms worsen or if the condition fails to improve as anticipated.  [facility] [phone]  Other Instructions  We are sorry that you are not feeling well.  Here is how we plan to help!  Your symptoms indicate a likely viral infection (Pharyngitis).   Pharyngitis is inflammation in the back of the throat which can cause a sore throat, scratchiness and sometimes difficulty swallowing.   Pharyngitis is typically caused by a respiratory virus and will just run its course.  Please keep in mind that your symptoms could last up  to 10 days.  For throat pain, we recommend over the counter oral pain relief medications such as acetaminophen or aspirin, or anti-inflammatory medications such as ibuprofen or naproxen sodium.  Topical treatments such as oral throat lozenges or sprays may be used as needed.  Avoid close contact with loved ones, especially the very young and elderly.  Remember to wash your hands thoroughly throughout the day as this is the number one way to prevent the spread of infection and wipe down door knobs and counters with disinfectant.  Home Care: Only take medications as instructed by your medical team. Do not drink alcohol while taking these medications. A steam or ultrasonic humidifier can help congestion.  You can place a towel over your head and breathe in the steam from hot water coming from a faucet. Avoid close contacts especially the very young and the elderly. Cover your mouth when you cough or sneeze. Always remember to wash your hands.  Get Help Right Away If: You develop worsening fever or throat pain. You develop a severe head ache or visual changes. Your symptoms persist after you have completed your treatment plan.  Make sure you Understand these instructions. Will watch your condition. Will get help right away if you are not doing well or get worse.  If you have been instructed to have an in-person evaluation today at a local Urgent Care facility, please use the link below. It will take you to a list of all of our available [facility], including address, phone number and hours of  operation. Please do not delay care.  [facility]  If you or a family member do not have a primary care provider, use the link below to schedule a visit and establish care. When you choose a [facility] primary care physician or advanced practice provider, you gain a long-term partner in health. Find a Primary Care Provider  Learn more about [facility]'s in-office and virtual  care options: [facility] Get Care Now

## 2023-05-18 NOTE — Progress Notes (Signed)
Virtual Visit Consent   Monique Reyes, you are scheduled for a virtual visit with a [facility] provider today. Just as with appointments in the office, your consent must be obtained to participate. Your consent will be active for this visit and any virtual visit you may have with one of our providers in the next 365 days. If you have a MyChart account, a copy of this consent can be sent to you electronically.  As this is a virtual visit, video technology does not allow for your provider to perform a traditional examination. This may limit your provider's ability to fully assess your condition. If your provider identifies any concerns that need to be evaluated in person or the need to arrange testing (such as labs, EKG, etc.), we will make arrangements to do so. Although advances in technology are sophisticated, we cannot ensure that it will always work on either your end or our end. If the connection with a video visit is poor, the visit may have to be switched to a telephone visit. With either a video or telephone visit, we are not always able to ensure that we have a secure connection.  By engaging in this virtual visit, you consent to the provision of healthcare and authorize for your insurance to be billed (if applicable) for the services provided during this visit. Depending on your insurance coverage, you may receive a charge related to this service.  I need to obtain your verbal consent now. Are you willing to proceed with your visit today? Monique Reyes has provided verbal consent on 05/18/2023 for a virtual visit (video or telephone). Piedad Climes, [place]  Date: 05/18/2023 12:34 PM  Virtual Visit via Video Note   I, Piedad Climes, connected with  Monique Reyes  ([id number], Apr 05, 1996) on 05/18/23 at 12:15 PM EDT by a video-enabled telemedicine application and verified that I am speaking with the correct person using two identifiers.  Location: Patient: Virtual Visit  Location Patient: Home Provider: Virtual Visit Location Provider: Home Office   I discussed the limitations of evaluation and management by telemedicine and the availability of in person appointments. The patient expressed understanding and agreed to proceed.    History of Present Illness: Monique Reyes is a 27 y.o. who identifies as a female who was assigned female at birth, and is being seen today for throat pain starting yesterday morning on waking. Initially associated with a low-grade fever that has dissipated. Notes some mild dry cough and nasal congestion with this. Today sore throat is improved but still present. Works at a daycare.    HPI: HPI  Problems: There are no problems to display for this patient.   Allergies: No Known Allergies Medications:  Current Outpatient Medications:    Ascorbic Acid (VITAMIN C PO), Take 1 tablet by mouth daily., Disp: , Rfl:    cetirizine (ZYRTEC) 10 MG tablet, Take by mouth., Disp: , Rfl:    levonorgestrel-ethinyl estradiol (ALESSE) 0.1-20 MG-MCG tablet, Take 1 tablet by mouth daily., Disp: 84 tablet, Rfl: 5   Prenatal Vit-Fe Fumarate-FA (MULTIVITAMIN-PRENATAL) 27-0.8 MG TABS tablet, Take 1 tablet by mouth daily at 12 noon., Disp: , Rfl:   Observations/Objective: Patient is well-developed, well-nourished in no acute distress.  Resting comfortably  at home.  Head is normocephalic, atraumatic.  No labored breathing.  Speech is clear and coherent with logical content.  Patient is alert and oriented at baseline.    Assessment and Plan: 1. Acute viral pharyngitis  Supportive  measures and OTC medications reviewed. Already improving which is reassuring. If worsening, recommend in person assessment for throat swab/culture to assess for strep as a precaution giving her work environment.   Follow Up Instructions: I discussed the assessment and treatment plan with the patient. The patient was provided an opportunity to ask questions and all were  answered. The patient agreed with the plan and demonstrated an understanding of the instructions.  A copy of instructions were sent to the patient via MyChart unless otherwise noted below.   The patient was advised to call back or seek an in-person evaluation if the symptoms worsen or if the condition fails to improve as anticipated.  Time:  I spent 10 minutes with the patient via telehealth technology discussing the above problems/concerns.    Piedad Climes, PA-C

## 2023-07-07 ENCOUNTER — Encounter: Payer: Self-pay | Admitting: *Deleted

## 2023-07-07 ENCOUNTER — Emergency Department
Admission: EM | Admit: 2023-07-07 | Discharge: 2023-07-07 | Disposition: A | Discharge status: Home / Self Care | Payer: Managed Care, Other (non HMO) | Attending: Emergency Medicine | Admitting: Emergency Medicine

## 2023-07-07 ENCOUNTER — Other Ambulatory Visit: Payer: Self-pay

## 2023-07-07 ENCOUNTER — Emergency Department: Payer: Managed Care, Other (non HMO)

## 2023-07-07 DIAGNOSIS — D72829 Elevated white blood cell count, unspecified: Secondary | ICD-10-CM | POA: Insufficient documentation

## 2023-07-07 DIAGNOSIS — E876 Hypokalemia: Secondary | ICD-10-CM | POA: Diagnosis not present

## 2023-07-07 DIAGNOSIS — N132 Hydronephrosis with renal and ureteral calculous obstruction: Secondary | ICD-10-CM | POA: Diagnosis not present

## 2023-07-07 DIAGNOSIS — R1032 Left lower quadrant pain: Secondary | ICD-10-CM | POA: Diagnosis present

## 2023-07-07 DIAGNOSIS — R109 Unspecified abdominal pain: Secondary | ICD-10-CM

## 2023-07-07 DIAGNOSIS — N201 Calculus of ureter: Secondary | ICD-10-CM

## 2023-07-07 LAB — CBC
HCT: 45.3 % (ref 36.0–46.0)
Hemoglobin: 14.8 g/dL (ref 12.0–15.0)
MCH: 29.2 pg (ref 26.0–34.0)
MCHC: 32.7 g/dL (ref 30.0–36.0)
MCV: 89.5 fL (ref 80.0–100.0)
Platelets: 356 10*3/uL (ref 150–400)
RBC: 5.06 MIL/uL (ref 3.87–5.11)
RDW: 13 % (ref 11.5–15.5)
WBC: 11 10*3/uL — ABNORMAL HIGH (ref 4.0–10.5)
nRBC: 0 % (ref 0.0–0.2)

## 2023-07-07 LAB — COMPREHENSIVE METABOLIC PANEL
ALT: 16 U/L (ref 0–44)
AST: 16 U/L (ref 15–41)
Albumin: 4.2 g/dL (ref 3.5–5.0)
Alkaline Phosphatase: 73 U/L (ref 38–126)
Anion gap: 12 (ref 5–15)
BUN: 10 mg/dL (ref 6–20)
CO2: 23 mmol/L (ref 22–32)
Calcium: 9.4 mg/dL (ref 8.9–10.3)
Chloride: 105 mmol/L (ref 98–111)
Creatinine, Ser: 0.81 mg/dL (ref 0.44–1.00)
GFR, Estimated: >60 mL/min (ref >60)
Glucose, Bld: 145 mg/dL — ABNORMAL HIGH (ref 70–99)
Potassium: 3.4 mmol/L — ABNORMAL LOW (ref 3.5–5.1)
Sodium: 140 mmol/L (ref 135–145)
Total Bilirubin: 0.4 mg/dL (ref 0.3–1.2)
Total Protein: 8.2 g/dL — ABNORMAL HIGH (ref 6.5–8.1)

## 2023-07-07 LAB — URINALYSIS, ROUTINE W REFLEX MICROSCOPIC
Bilirubin Urine: NEGATIVE
Glucose, UA: NEGATIVE mg/dL
Ketones, ur: 5 mg/dL — AB
Leukocytes,Ua: NEGATIVE
Nitrite: NEGATIVE
Protein, ur: 100 mg/dL — AB
RBC / HPF: >50 RBC/hpf (ref 0–5)
Specific Gravity, Urine: 1.023 (ref 1.005–1.030)
pH: 6 (ref 5.0–8.0)

## 2023-07-07 LAB — LIPASE, BLOOD: Lipase: 25 U/L (ref 11–51)

## 2023-07-07 LAB — POC URINE PREG, ED: Preg Test, Ur: NEGATIVE

## 2023-07-07 MED ORDER — SODIUM CHLORIDE 0.9 % IV BOLUS
1000.0000 mL | Freq: Once | INTRAVENOUS | Status: AC
  Administered 2023-07-07: 1000 mL via INTRAVENOUS

## 2023-07-07 MED ORDER — TAMSULOSIN HCL 0.4 MG PO CAPS: 0.4000 mg | ORAL_CAPSULE | Freq: Every day | ORAL | 0 refills | Status: AC

## 2023-07-07 MED ORDER — SODIUM CHLORIDE 0.9 % IV SOLN
1.0000 g | Freq: Once | INTRAVENOUS | Status: AC
  Administered 2023-07-07: 1 g via INTRAVENOUS
  Filled 2023-07-07: qty 10

## 2023-07-07 MED ORDER — MORPHINE SULFATE (PF) 4 MG/ML IV SOLN
4.0000 mg | Freq: Once | INTRAVENOUS | Status: AC
  Administered 2023-07-07: 4 mg via INTRAVENOUS
  Filled 2023-07-07: qty 1

## 2023-07-07 MED ORDER — ONDANSETRON 4 MG PO TBDP: 4.0000 mg | ORAL_TABLET | Freq: Three times a day (TID) | ORAL | 0 refills | Status: DC | PRN

## 2023-07-07 MED ORDER — CEPHALEXIN 500 MG PO CAPS: 500.0000 mg | ORAL_CAPSULE | Freq: Three times a day (TID) | ORAL | 0 refills | Status: AC

## 2023-07-07 MED ORDER — KETOROLAC TROMETHAMINE 30 MG/ML IJ SOLN
30.0000 mg | Freq: Once | INTRAMUSCULAR | Status: AC
  Administered 2023-07-07: 30 mg via INTRAVENOUS
  Filled 2023-07-07: qty 1

## 2023-07-07 MED ORDER — ONDANSETRON HCL 4 MG/2ML IJ SOLN
4.0000 mg | Freq: Once | INTRAMUSCULAR | Status: AC
  Administered 2023-07-07: 4 mg via INTRAVENOUS
  Filled 2023-07-07: qty 2

## 2023-07-07 MED ORDER — HYDROCODONE-ACETAMINOPHEN 5-325 MG PO TABS: 1.0000 | ORAL_TABLET | Freq: Three times a day (TID) | ORAL | 0 refills | Status: AC | PRN

## 2023-07-07 NOTE — ED Notes (Addendum)
Error in charting.

## 2023-07-07 NOTE — ED Triage Notes (Addendum)
BIB family from home for LLQ abd pain, onset this am, endorses pain, NV. Denies back pain, constipation, diarrhea, urinary or vaginal sx, or fever. Tylenol taken at 0945 w/o relief. Last BM this am (normal). Last ate last night. Writhing in pain, mildly diaphoretic, currently vomiting.

## 2023-07-07 NOTE — Discharge Instructions (Addendum)
Take the antibiotic and bladder spasm medicine as directed.  The narcotic pain medicine and bladder spasm medicine as needed.  Follow-up with your primary provider as needed.

## 2023-07-07 NOTE — ED Provider Triage Note (Signed)
Emergency Medicine Provider Triage Evaluation Note  Monique Reyes, a 27 y.o. female  was evaluated in triage.  Pt complains of LLQ/LUQ abd pain.   Review of Systems  Positive: LLq/LUQ adb pain, N?V Negative: FCS  Physical Exam  BP (!) 147/95 (BP Location: Left Arm)   Pulse 60   Temp 98 F (36.7 C) (Oral)   Resp 20   Ht 5\' 7"  (1.702 m)   Wt 104.3 kg   LMP 06/07/2023 (Approximate)   SpO2 97%   BMI 36.02 kg/m  Gen:   Awake, no distress  active vomiting Resp:  Normal effort  MSK:   Moves extremities without difficulty  Other:  TTP LLQ/flank  Medical Decision Making  Medically screening exam initiated at 1:35 PM.  Appropriate orders placed.  Monique Reyes was informed that the remainder of the evaluation will be completed by another provider, this initial triage assessment does not replace that evaluation, and the importance of remaining in the ED until their evaluation is complete.  Patient to the ED for evaluation of LLQ/flank pain w/ NV   Yvonne Stopher, Charlesetta Ivory, PA-C 07/07/23 1403

## 2023-07-07 NOTE — ED Notes (Signed)
IV established, zofran given, blood sent, up to b/r, mother present

## 2023-07-07 NOTE — ED Notes (Signed)
See triage note  Presents with some abd pain  States pain is mainly to LLQ  No fever or n/v

## 2023-07-07 NOTE — ED Provider Notes (Signed)
[facility] Emergency Department Provider Note     Event Date/Time   First MD Initiated Contact with Patient 07/07/23 1411     (approximate)   History   Abdominal Pain   HPI  Monique Reyes is a 27 y.o. female presents to the ED accompanied by family for sudden onset of left lower quadrant abdominal and flank pain.  Patient reports onset this morning.  She also has experienced some nausea and vomiting associate with the pain.  She denies any bladder or bowel changes or any urinary symptoms.  She is Tylenol prior to arrival with limited benefit.  She reports a normal bowel movement this morning.  She denies any history of kidney stones, diverticulitis, or ovarian cyst.  Physical Exam   Triage Vital Signs: ED Triage Vitals  Encounter Vitals Group     BP 07/07/23 1327 (!) 147/95     Systolic BP Percentile --      Diastolic BP Percentile --      Pulse Rate 07/07/23 1327 60     Resp 07/07/23 1327 20     Temp 07/07/23 1327 98 F (36.7 C)     Temp Source 07/07/23 1327 Oral     SpO2 07/07/23 1327 97 %     Weight 07/07/23 1328 230 lb (104.3 kg)     Height 07/07/23 1328 5\' 7"  (1.702 m)     Head Circumference --      Peak Flow --      Pain Score 07/07/23 1328 10     Pain Loc --      Pain Education --      Exclude from Growth Chart --     Most recent vital signs: Vitals:   07/07/23 1327  BP: (!) 147/95  Pulse: 60  Resp: 20  Temp: 98 F (36.7 C)  SpO2: 97%    General Awake, no distress. uncomfortable HEENT NCAT. PERRL. EOMI. No rhinorrhea. Mucous membranes are moist.  CV:  Good peripheral perfusion.  RESP:  Normal effort.  ABD:  No distention.  Tender to palpation to the left flank and left lower quadrant. GU:  deferred   ED Results / Procedures / Treatments   Labs (all labs ordered are listed, but only abnormal results are displayed) Labs Reviewed  COMPREHENSIVE METABOLIC PANEL - Abnormal; Notable for the following components:       Result Value   Potassium 3.4 (*)    Glucose, Bld 145 (*)    Total Protein 8.2 (*)    All other components within normal limits  CBC - Abnormal; Notable for the following components:   WBC 11.0 (*)    All other components within normal limits  URINALYSIS, ROUTINE W REFLEX MICROSCOPIC - Abnormal; Notable for the following components:   Color, Urine AMBER (*)    APPearance CLOUDY (*)    Hgb urine dipstick LARGE (*)    Ketones, ur 5 (*)    Protein, ur 100 (*)    Bacteria, UA MANY (*)    All other components within normal limits  URINE CULTURE  LIPASE, BLOOD  POC URINE PREG, ED     EKG   RADIOLOGY  I personally viewed and evaluated these images as part of my medical decision making, as well as reviewing the written report by the radiologist.  ED Provider Interpretation: Right ureterolithiasis with mild hydronephrosis  CT Renal Stone Study  Result Date: 07/07/2023 CLINICAL DATA:  Abdominal/flank pain, stone suspected. EXAM: CT ABDOMEN  AND PELVIS WITHOUT CONTRAST TECHNIQUE: Multidetector CT imaging of the abdomen and pelvis was performed following the standard protocol without IV contrast. RADIATION DOSE REDUCTION: This exam was performed according to the departmental dose-optimization program which includes automated exposure control, adjustment of the mA and/or kV according to patient size and/or use of iterative reconstruction technique. COMPARISON:  None Available. FINDINGS: Lower chest: No acute abnormality. Hepatobiliary: No focal liver abnormality is seen. No gallstones, gallbladder wall thickening, or biliary dilatation. Pancreas: Unremarkable. No pancreatic ductal dilatation or surrounding inflammatory changes. Spleen: Normal in size without focal abnormality. Adrenals/Urinary Tract: Adrenal glands are unremarkable. Mild left hydroureteronephrosis secondary to a 2 mm calculus in the distal left ureter. Right kidney and ureter are normal. Bladder is unremarkable. Stomach/Bowel:  Stomach is within normal limits. Appendix appears normal. No evidence of bowel wall thickening, distention, or inflammatory changes. Vascular/Lymphatic: No significant vascular findings are present. No enlarged abdominal or pelvic lymph nodes. Reproductive: Uterus and bilateral adnexa are unremarkable. Other: No abdominal wall hernia or abnormality. No abdominopelvic ascites. Musculoskeletal: No acute or significant osseous findings. IMPRESSION: Mild left hydroureteronephrosis secondary to a 2 mm calculus in the distal left ureter. Electronically Signed   By: Larose Hires D.O.   On: 07/07/2023 15:08     PROCEDURES:  Critical Care performed: No  Procedures   MEDICATIONS ORDERED IN ED: Medications  ondansetron (ZOFRAN) injection 4 mg (4 mg Intravenous Given 07/07/23 1340)  morphine (PF) 4 MG/ML injection 4 mg (4 mg Intravenous Given 07/07/23 1352)  sodium chloride 0.9 % bolus 1,000 mL (1,000 mLs Intravenous New Bag/Given 07/07/23 1606)  ketorolac (TORADOL) 30 MG/ML injection 30 mg (30 mg Intravenous Given 07/07/23 1606)  cefTRIAXone (ROCEPHIN) 1 g in sodium chloride 0.9 % 100 mL IVPB (0 g Intravenous Stopped 07/07/23 1633)     IMPRESSION / MDM / ASSESSMENT AND PLAN / ED COURSE  I reviewed the triage vital signs and the nursing notes.                              Differential diagnosis includes, but is not limited to, ovarian cyst, ovarian torsion, acute appendicitis, diverticulitis, urinary tract infection/pyelonephritis, bowel obstruction, colitis, renal colic, gastroenteritis, hernia, fibroids, endometriosis, pregnancy related pain including ectopic pregnancy, etc.   Patient's presentation is most consistent with acute complicated illness / injury requiring diagnostic workup.  Patient's diagnosis is consistent with left flank pain secondary to a 2 mm distal ureteral stone.  Patient also has CT evidence of some mild hydronephrosis.  Presented to the ED with left lower quadrant and flank pain  with associated nausea and vomiting.  Patient was evaluated for complaints in ED, found to have evidence of leukocyturia with many bacteria.  White blood cell was mildly elevated at 11, potassium is mildly decreased at 3.4.  Remaining labs are unremarkable including lipase and urine pregnancy test.  Patient was further evaluated with a renal stone CT which did confirm a mild hydronephrosis and the distal left ureteral stone.  Patient was treated with a fluid bolus as well as IV Toradol, Zofran, p.o. pain medicine.  She stable at this time resting comfortably, able to void spontaneously without ongoing pain.  Patient stable for outpatient management of the admission was considered.  Patient will be discharged home with prescriptions for Zofran, Vicodin, tamsulosin, and Keflex. Patient is to follow up with her PCP as needed or otherwise directed. Patient is given ED precautions to return to the  ED for any worsening or new symptoms.   FINAL CLINICAL IMPRESSION(S) / ED DIAGNOSES   Final diagnoses:  Acute left flank pain  Ureterolithiasis     Rx / DC Orders   ED Discharge Orders          Ordered    tamsulosin (FLOMAX) 0.4 MG CAPS capsule  Daily after breakfast        07/07/23 1747    ondansetron (ZOFRAN-ODT) 4 MG disintegrating tablet  Every 8 hours PRN        07/07/23 1747    HYDROcodone-acetaminophen (NORCO) 5-325 MG tablet  3 times daily PRN        07/07/23 1747    cephALEXin (KEFLEX) 500 MG capsule  3 times daily        07/07/23 1747             Note:  This document was prepared using Dragon voice recognition software and may include unintentional dictation errors.    Lissa Hoard, PA-C 07/07/23 1759    Dionne Bucy, MD 07/07/23 2007

## 2023-07-09 LAB — URINE CULTURE

## 2024-01-02 NOTE — Progress Notes (Signed)
 PCP:  [facility] [place]   Chief Complaint  Patient presents with   Gynecologic Exam    No concerns     HPI:      [name] is a 28 y.o. G0P0000 who LMP was Patient's last menstrual period was 12/26/2023 (exact date)., presents today for her annual examination.  Her menses are regular every 28-30 days, lasting 4-5 days, mod flow, no BTB.  Dysmenorrhea none.   Sex activity: single partner, contraception - OCP (estrogen/progesterone). No pain/bleeding/dryness. Last pap: 11/29/22 Results: ASCUS; no HPV testing done; repeat pap due today. Husband is [employer] employee. Hx of STDs: none  There is no FH of breast cancer. There is a FH of ovarian cancer in her MGGM, genetic testing not indicated for pt. Pt's MGM is gene neg; mom is MyRisk neg. The patient does not do self-breast exams.  Tobacco use: The patient denies current or previous tobacco use. Alcohol use: none No drug use.  Exercise: not active  She does get adequate calcium but not Vitamin D in her diet.  Gardasil vaccine declined in the past. Hx of gestational DM; s/p abnormal 2 hr GTT last yr, referred to PCP but doesn't have one. Repeat labs due today.  Gets cystic acne lesions on bilat inner thighs, bilat axilla and occas under breasts. Leaves dark scars/hyperpigmentation. Unsure if FH HS.   Past Medical History:  Diagnosis Date   Encounter for supervision of normal first pregnancy in second trimester 01/19/2021   [facility] Prenatal Labs Dating By 9w u/s Blood type: A/Positive/-- (01/25 0935)  Genetic Screen  declined Antibody:Negative (01/25 0935) Anatomic US  Normal female Addison Rubella: 1.07 (01/25 0935)  Varicella: non-immune GTT Early:  112              Third trimester: 151  3hr: all 4 abn RPR: Non Reactive (01/25 0935)  Rhogam Not needed  HBsAg: Negative (01/25 0935)  Vaccines TDAP:    6/29/202   Gestational diabetes    Migraine     Past Surgical History:  Procedure Laterality Date    ADENOIDECTOMY     TYMPANOSTOMY TUBE PLACEMENT     TYMPANOSTOMY TUBE PLACEMENT      Family History  Problem Relation Age of Onset   Healthy Mother    Other Father        unknown medical history   Diabetes Maternal Grandfather    Ovarian cancer Other        unsure of age   Colon cancer Other        unsure of age    Social History   Socioeconomic History   Marital status: Married    Spouse name: Not on file   Number of children: Not on file   Years of education: Not on file   Highest education level: Not on file  Occupational History   Not on file  Tobacco Use   Smoking status: Never   Smokeless tobacco: Former   Tobacco comments:    Vaping - not since pregnant  Vaping Use   Vaping status: Former  Substance and Sexual Activity   Alcohol use: Not Currently   Drug use: No   Sexual activity: Yes    Birth control/protection: Pill  Other Topics Concern   Not on file  Social History Narrative   Not on file   Social Drivers of Health   Financial Resource Strain: Not on file  Food Insecurity: Not on file  Transportation Needs: Not on  file  Physical Activity: Not on file  Stress: Not on file  Social Connections: Not on file  Intimate Partner Violence: Not on file    Current Meds  Medication Sig   cetirizine (ZYRTEC) 10 MG tablet Take by mouth.   [DISCONTINUED] levonorgestrel -ethinyl estradiol (ALESSE) 0.1-20 MG-MCG tablet Take 1 tablet by mouth daily.     ROS:  Review of Systems  Constitutional:  Negative for fatigue, fever and unexpected weight change.  Respiratory:  Negative for cough, shortness of breath and wheezing.   Cardiovascular:  Negative for chest pain, palpitations and leg swelling.  Gastrointestinal:  Negative for blood in stool, constipation, diarrhea, nausea and vomiting.  Endocrine: Negative for cold intolerance, heat intolerance and polyuria.  Genitourinary:  Negative for dyspareunia, dysuria, flank pain, frequency, genital sores, hematuria,  menstrual problem, pelvic pain, urgency, vaginal bleeding, vaginal discharge and vaginal pain.  Musculoskeletal:  Negative for back pain, joint swelling and myalgias.  Skin:  Negative for rash.  Neurological:  Negative for dizziness, syncope, light-headedness, numbness and headaches.  Hematological:  Negative for adenopathy.  Psychiatric/Behavioral:  Negative for agitation, confusion, sleep disturbance and suicidal ideas. The patient is not nervous/anxious.      Objective: BP 100/70   Ht 5' 7 (1.702 m)   Wt 221 lb (100.2 kg)   LMP 12/26/2023 (Exact Date)   BMI 34.61 kg/m    Physical Exam Constitutional:      Appearance: She is well-developed.  Genitourinary:     Vulva normal.     Genitourinary Comments: FEW HYPERPIGMENTED RESOLVING LESIONS; NO TRUE SCARS     Right Labia: No rash, tenderness or lesions.    Left Labia: No tenderness, lesions or rash.       No vaginal discharge, erythema or tenderness.      Right Adnexa: not tender and no mass present.    Left Adnexa: not tender and no mass present.    No cervical motion tenderness, friability or polyp.     Uterus is not enlarged or tender.  Breasts:    Right: No mass, nipple discharge, skin change or tenderness.     Left: No mass, nipple discharge, skin change or tenderness.  Neck:     Thyroid: No thyromegaly.  Cardiovascular:     Rate and Rhythm: Normal rate and regular rhythm.     Heart sounds: Normal heart sounds. No murmur heard. Pulmonary:     Effort: Pulmonary effort is normal.     Breath sounds: Normal breath sounds.  Abdominal:     Palpations: Abdomen is soft.     Tenderness: There is no abdominal tenderness. There is no guarding or rebound.  Musculoskeletal:        General: Normal range of motion.     Cervical back: Normal range of motion.  Lymphadenopathy:     Cervical: No cervical adenopathy.  Neurological:     General: No focal deficit present.     Mental Status: She is alert and oriented to person,  place, and time.     Cranial Nerves: No cranial nerve deficit.  Skin:    General: Skin is warm and dry.  Psychiatric:        Mood and Affect: Mood normal.        Behavior: Behavior normal.        Thought Content: Thought content normal.        Judgment: Judgment normal.  Vitals reviewed.    Assessment/Plan: Encounter for annual routine gynecological examination  Cervical cancer screening -  Plan: IGP, Aptima HPV  Screening for HPV (human papillomavirus) - Plan: IGP, Aptima HPV,   Atypical squamous cells of undetermined significance on cytologic smear of cervix (ASC-US ) - Plan: IGP, Aptima HPV, repeat pap today. Will f/u if abn.   Encounter for surveillance of contraceptive pills - Plan: levonorgestrel -ethinyl estradiol (ALESSE) 0.1-20 MG-MCG tablet; OCR RF eRxd.   Type 2 diabetes mellitus without complication, without long-term current use of insulin (HCC) - Plan: Comprehensive metabolic panel, Hemoglobin A1c; check labs. If abn, will refer to PCP  Hidradenitis suppurativa--question mild case vs cystic lesions, possibly exacerbated by uncontrolled glucose. Dial antibacterial soap/warm compresses. F/u prn.    Meds ordered this encounter  Medications   levonorgestrel -ethinyl estradiol (ALESSE) 0.1-20 MG-MCG tablet    Sig: Take 1 tablet by mouth daily.    Dispense:  84 tablet    Refill:  3    Supervising Provider:   CONNELL ARCHIE FILES             GYN counsel adequate intake of calcium and vitamin D, Gardasil vaccine     F/U  Return in about 1 year (around 01/03/2025).  Sephiroth Mcluckie B. Alaylah Heatherington, PA-C 01/04/2024 4:19 PM

## 2024-01-04 ENCOUNTER — Encounter: Payer: Self-pay | Admitting: Obstetrics and Gynecology

## 2024-01-04 ENCOUNTER — Ambulatory Visit (INDEPENDENT_AMBULATORY_CARE_PROVIDER_SITE_OTHER): Payer: 59 | Admitting: Obstetrics and Gynecology

## 2024-01-04 VITALS — BP 100/70 | Ht 67.0 in | Wt 221.0 lb

## 2024-01-04 DIAGNOSIS — E119 Type 2 diabetes mellitus without complications: Secondary | ICD-10-CM

## 2024-01-04 DIAGNOSIS — Z1151 Encounter for screening for human papillomavirus (HPV): Secondary | ICD-10-CM

## 2024-01-04 DIAGNOSIS — Z01411 Encounter for gynecological examination (general) (routine) with abnormal findings: Secondary | ICD-10-CM

## 2024-01-04 DIAGNOSIS — Z124 Encounter for screening for malignant neoplasm of cervix: Secondary | ICD-10-CM

## 2024-01-04 DIAGNOSIS — L732 Hidradenitis suppurativa: Secondary | ICD-10-CM | POA: Diagnosis not present

## 2024-01-04 DIAGNOSIS — Z3041 Encounter for surveillance of contraceptive pills: Secondary | ICD-10-CM

## 2024-01-04 DIAGNOSIS — Z01419 Encounter for gynecological examination (general) (routine) without abnormal findings: Secondary | ICD-10-CM

## 2024-01-04 DIAGNOSIS — R87610 Atypical squamous cells of undetermined significance on cytologic smear of cervix (ASC-US): Secondary | ICD-10-CM

## 2024-01-04 MED ORDER — LEVONORGESTREL-ETHINYL ESTRAD 0.1-20 MG-MCG PO TABS: 1.0000 | ORAL_TABLET | Freq: Every day | ORAL | 3 refills | Status: DC

## 2024-01-04 NOTE — Patient Instructions (Signed)
 I value your feedback and you entrusting [place] with your care. If you get a [place] patient survey, I would appreciate you taking the time to let us know about your experience today. Thank you! ? ? ?

## 2024-01-05 LAB — COMPREHENSIVE METABOLIC PANEL
ALT: 17 [IU]/L (ref 0–32)
AST: 17 [IU]/L (ref 0–40)
Albumin: 4.3 g/dL (ref 4.0–5.0)
Alkaline Phosphatase: 78 [IU]/L (ref 44–121)
BUN/Creatinine Ratio: 18 (ref 9–23)
BUN: 12 mg/dL (ref 6–20)
Bilirubin Total: 0.2 mg/dL (ref 0.0–1.2)
CO2: 24 mmol/L (ref 20–29)
Calcium: 9.7 mg/dL (ref 8.7–10.2)
Chloride: 102 mmol/L (ref 96–106)
Creatinine, Ser: 0.68 mg/dL (ref 0.57–1.00)
Globulin, Total: 2.7 g/dL (ref 1.5–4.5)
Glucose: 86 mg/dL (ref 70–99)
Potassium: 4.4 mmol/L (ref 3.5–5.2)
Sodium: 140 mmol/L (ref 134–144)
Total Protein: 7 g/dL (ref 6.0–8.5)
eGFR: 122 mL/min/{1.73_m2} (ref >59)

## 2024-01-05 LAB — HEMOGLOBIN A1C
Est. average glucose Bld gHb Est-mCnc: 114 mg/dL
Hgb A1c MFr Bld: 5.6 % (ref 4.8–5.6)

## 2024-01-10 LAB — IGP, APTIMA HPV: HPV Aptima: NEGATIVE

## 2024-12-20 ENCOUNTER — Other Ambulatory Visit: Payer: Self-pay | Admitting: Obstetrics and Gynecology

## 2024-12-20 ENCOUNTER — Other Ambulatory Visit: Payer: Self-pay | Admitting: Licensed Practical Nurse

## 2024-12-20 DIAGNOSIS — Z3041 Encounter for surveillance of contraceptive pills: Secondary | ICD-10-CM

## 2025-01-03 NOTE — Progress Notes (Unsigned)
 "    Gynecology Annual Exam   PCP: [facility], [place]  Chief Complaint: No chief complaint on file.   History of Present Illness: Patient is a 29 y.o. G1P1001 presents for annual exam. The patient has no complaints today.   LMP: No LMP recorded. Average Interval: {Desc; regular/irreg:14544}, {numbers 22-35:14824} days Duration of flow: {numbers; 0-10:33138} days Heavy Menses: {yes/no:63} Clots: {yes/no:63} Intermenstrual Bleeding: {yes/no:63} Postcoital Bleeding: {yes/no:63} Dysmenorrhea: {yes/no:63}  The patient {sys sexually active:13135} sexually active. She currently uses {method:5051} for contraception. She {has/denies:315300} dyspareunia.  The patient {DOES_DOES WNU:81435} perform self breast exams.  There {is/is no:19420} notable family history of breast or ovarian cancer in her family.  The patient wears seatbelts: {yes/no:63}.   The patient has regular exercise: {yes/no/not asked:9010}.    The patient {Blank single:19197::reports,denies} current symptoms of depression.    Review of Systems: ROS  Past Medical History:  There are no active problems to display for this patient.   Past Surgical History:  Past Surgical History:  Procedure Laterality Date   ADENOIDECTOMY     TYMPANOSTOMY TUBE PLACEMENT     TYMPANOSTOMY TUBE PLACEMENT      Gynecologic History:  No LMP recorded. Contraception: {method:5051} Last Pap: Results were: {Findings; lab pap smear results:16707::NIL and HR HPV+,NIL and HR HPV negative}   Obstetric History: G1P1001  Family History:  Family History  Problem Relation Age of Onset   Healthy Mother    Other Father        unknown medical history   Diabetes Maternal Grandfather    Ovarian cancer Other        unsure of age   Colon cancer Other        unsure of age    Social History:  Social History   Socioeconomic History   Marital status: Married    Spouse name: Not on file   Number of children: Not on file   Years  of education: Not on file   Highest education level: Not on file  Occupational History   Not on file  Tobacco Use   Smoking status: Never   Smokeless tobacco: Former   Tobacco comments:    Vaping - not since pregnant  Vaping Use   Vaping status: Former  Substance and Sexual Activity   Alcohol use: Not Currently   Drug use: No   Sexual activity: Yes    Birth control/protection: Pill  Other Topics Concern   Not on file  Social History Narrative   Not on file   Social Drivers of Health   Tobacco Use: Medium Risk (01/04/2024)   Patient History    Smoking Tobacco Use: Never    Smokeless Tobacco Use: Former    Passive Exposure: Not on Actuary Strain: Not on file  Food Insecurity: Not on file  Transportation Needs: Not on file  Physical Activity: Not on file  Stress: Not on file  Social Connections: Not on file  Intimate Partner Violence: Not on file  Depression (PHQ2-9): Not on file  Alcohol Screen: Not on file  Housing: Not on file  Utilities: Not on file  Health Literacy: Not on file    Allergies:  Allergies[1]  Medications: Prior to Admission medications  Medication Sig Start Date End Date Taking? Authorizing Provider  cetirizine (ZYRTEC) 10 MG tablet Take by mouth.    [provider]  levonorgestrel -ethinyl estradiol (VIENVA) 0.1-20 MG-MCG tablet TAKE 1 TABLET BY MOUTH DAILY 12/20/24   Korinne Greenstein, Jinnie Jansky, CNM  Physical Exam Vitals: There were no vitals taken for this visit.  General: NAD HEENT: normocephalic, anicteric Thyroid: no enlargement, no palpable nodules Pulmonary: No increased work of breathing, CTAB Cardiovascular: RRR, distal pulses 2+ Breast: Breast symmetrical, no tenderness, no palpable nodules or masses, no skin or nipple retraction present, no nipple discharge.  No axillary or supraclavicular lymphadenopathy. Abdomen: NABS, soft, non-tender, non-distended.  Umbilicus without lesions.  No hepatomegaly, splenomegaly  or masses palpable. No evidence of hernia  Genitourinary:  External: Normal external female genitalia.  Normal urethral meatus, normal Bartholin's and Skene's glands.    Vagina: Normal vaginal mucosa, no evidence of prolapse.    Cervix: Grossly normal in appearance, no bleeding  Uterus: Non-enlarged, mobile, normal contour.  No CMT  Adnexa: ovaries non-enlarged, no adnexal masses  Rectal: deferred  Lymphatic: no evidence of inguinal lymphadenopathy Extremities: no edema, erythema, or tenderness Neurologic: Grossly intact Psychiatric: mood appropriate, affect full  Female chaperone present for pelvic and breast  portions of the physical exam    Assessment: 29 y.o. G1P1001 routine annual exam  Plan: Problem List Items Addressed This Visit   None   2) STI screening  {Blank single:19197::was,was not}offered and {Blank single:19197::accepted,declined,therefore not obtained}  2)  ASCCP guidelines and rational discussed.  Patient opts for {Blank single:19197::***,every 5 years,every 3 years,yearly,discontinue age >65,discontinue secondary to prior hysterectomy} screening interval  3) Contraception - the patient is currently using  {method:5051}.  She is {Blank single:19197::happy with her current form of contraception and plans to continue,interested in changing to ***,interested in starting Contraception: ***,not currently in need of contraception secondary to being sterile,attempting to conceive in the near future}  4) Routine healthcare maintenance including cholesterol, diabetes screening discussed {Blank single:19197::managed by PCP,Ordered today,To return fasting at a later date,Declines}  5) No follow-ups on file.  Jinnie Cookey, CNM  [facility] 01/03/2025, 3:03 PM        [1] No Known Allergies  "

## 2025-01-06 ENCOUNTER — Ambulatory Visit (INDEPENDENT_AMBULATORY_CARE_PROVIDER_SITE_OTHER): Admitting: Licensed Practical Nurse

## 2025-01-06 ENCOUNTER — Encounter: Payer: Self-pay | Admitting: Licensed Practical Nurse

## 2025-01-06 ENCOUNTER — Other Ambulatory Visit (HOSPITAL_COMMUNITY)
Admission: RE | Admit: 2025-01-06 | Discharge: 2025-01-06 | Disposition: A | Source: Ambulatory Visit | Attending: Licensed Practical Nurse | Admitting: Licensed Practical Nurse

## 2025-01-06 ENCOUNTER — Ambulatory Visit: Admitting: Licensed Practical Nurse

## 2025-01-06 VITALS — BP 125/83 | HR 74 | Ht 67.0 in | Wt 216.6 lb

## 2025-01-06 DIAGNOSIS — Z3041 Encounter for surveillance of contraceptive pills: Secondary | ICD-10-CM

## 2025-01-06 DIAGNOSIS — Z113 Encounter for screening for infections with a predominantly sexual mode of transmission: Secondary | ICD-10-CM

## 2025-01-06 DIAGNOSIS — Z124 Encounter for screening for malignant neoplasm of cervix: Secondary | ICD-10-CM

## 2025-01-06 DIAGNOSIS — Z131 Encounter for screening for diabetes mellitus: Secondary | ICD-10-CM

## 2025-01-06 DIAGNOSIS — Z01419 Encounter for gynecological examination (general) (routine) without abnormal findings: Secondary | ICD-10-CM | POA: Insufficient documentation

## 2025-01-06 DIAGNOSIS — Z13220 Encounter for screening for lipoid disorders: Secondary | ICD-10-CM

## 2025-01-06 DIAGNOSIS — Z1159 Encounter for screening for other viral diseases: Secondary | ICD-10-CM

## 2025-01-06 MED ORDER — LEVONORGESTREL-ETHINYL ESTRAD 0.1-20 MG-MCG PO TABS: 1.0000 | ORAL_TABLET | Freq: Every day | ORAL | 3 refills | Status: AC

## 2025-01-06 NOTE — Patient Instructions (Addendum)
 Preventive Care 29-29 Years Old, Female Preventive care refers to lifestyle choices and visits with your health care provider that can promote health and wellness. Preventive care visits are also called wellness exams. What can I expect for my preventive care visit? Counseling During your preventive care visit, your health care provider may ask about your: Medical history, including: Past medical problems. Family medical history. Pregnancy history. Current health, including: Menstrual cycle. Method of birth control. Emotional well-being. Home life and relationship well-being. Sexual activity and sexual health. Lifestyle, including: Alcohol, nicotine or tobacco, and drug use. Access to firearms. Diet, exercise, and sleep habits. Work and work Astronomer. Sunscreen use. Safety issues such as seatbelt and bike helmet use. Physical exam Your health care provider may check your: Height and weight. These may be used to calculate your BMI (body mass index). BMI is a measurement that tells if you are at a healthy weight. Waist circumference. This measures the distance around your waistline. This measurement also tells if you are at a healthy weight and may help predict your risk of certain diseases, such as type 2 diabetes and high blood pressure. Heart rate and blood pressure. Body temperature. Skin for abnormal spots. What immunizations do I need?  Vaccines are usually given at various ages, according to a schedule. Your health care provider will recommend vaccines for you based on your age, medical history, and lifestyle or other factors, such as travel or where you work. What tests do I need? Screening Your health care provider may recommend screening tests for certain conditions. This may include: Pelvic exam and Pap test. Lipid and cholesterol levels. Diabetes screening. This is done by checking your blood sugar (glucose) after you have not eaten for a while (fasting). Hepatitis  B test. Hepatitis C test. HIV (human immunodeficiency virus) test. STI (sexually transmitted infection) testing, if you are at risk. BRCA-related cancer screening. This may be done if you have a family history of breast, ovarian, tubal, or peritoneal cancers. Talk with your health care provider about your test results, treatment options, and if necessary, the need for more tests. Follow these instructions at home: Eating and drinking  Eat a healthy diet that includes fresh fruits and vegetables, whole grains, lean protein, and low-fat dairy products. Take vitamin and mineral supplements as recommended by your health care provider. Do not drink alcohol if: Your health care provider tells you not to drink. You are pregnant, may be pregnant, or are planning to become pregnant. If you drink alcohol: Limit how much you have to 0-1 drink a day. Know how much alcohol is in your drink. In the [place] one drink equals one 12 oz bottle of beer (355 mL), one 5 oz glass of wine (148 mL), or one 1 oz glass of hard liquor (44 mL). Lifestyle Brush your teeth every morning and night with fluoride toothpaste. Floss one time each day. Exercise for at least 30 minutes 5 or more days each week. Do not use any products that contain nicotine or tobacco. These products include cigarettes, chewing tobacco, and vaping devices, such as e-cigarettes. If you need help quitting, ask your health care provider. Do not use drugs. If you are sexually active, practice safe sex. Use a condom or other form of protection to prevent STIs. If you do not wish to become pregnant, use a form of birth control. If you plan to become pregnant, see your health care provider for a prepregnancy visit. Find healthy ways to manage stress, such as: Meditation,  yoga, or listening to music. Journaling. Talking to a trusted person. Spending time with friends and family. Minimize exposure to UV radiation to reduce your risk of skin  cancer. Safety Always wear your seat belt while driving or riding in a vehicle. Do not drive: If you have been drinking alcohol. Do not ride with someone who has been drinking. If you have been using any mind-altering substances or drugs. While texting. When you are tired or distracted. Wear a helmet and other protective equipment during sports activities. If you have firearms in your house, make sure you follow all gun safety procedures. Seek help if you have been physically or sexually abused. What's next? Go to your health care provider once a year for an annual wellness visit. Ask your health care provider how often you should have your eyes and teeth checked. Stay up to date on all vaccines. This information is not intended to replace advice given to you by your health care provider. Make sure you discuss any questions you have with your health care provider.  How to Do a Breast Self-Exam Doing breast self-exams can help you stay healthy. They're one way to know what's normal for your breasts. They can help you catch a problem while it's still small and can be treated. You need to: Check your breasts often. Tell your doctor about any changes. You should do breast self-exams even if you have breast implants. What you need: A mirror. A well-lit room. A pillow or other soft object. How to do a breast self-exam Look for changes  Take off all the clothes above your waist. Stand in front of a mirror in a room with good lighting. Put your hands down at your sides. Compare your breasts in the mirror. Look for difference between them, such as: Differences in shape. Differences in size. Wrinkles, dips, and bumps in one breast and not the other. Look at each breast for skin changes, such as: Redness. Scaly spots. Spots where your skin is thicker. Dimpling. Open sores. Look for changes in your nipples, such as: Fluid coming out of a nipple. Fluid around a  nipple. Bleeding. Dimpling. Redness. A nipple that looks pushed in or that has changed position. Feel for changes Lie on your back. Feel each breast. To do this: Pick a breast to feel. Place a pillow under the shoulder closest to that breast. Put the arm closest to that breast behind your head. Feel the breast using the hand of your other arm. Use the pads of your three middle fingers to make small circles starting near the nipple. Use light, medium, and firm pressure. Keep making circles, moving down over the breast. Stop when you feel your ribs. Start making circles with your fingers again, this time going up until you reach your collarbone. Then, make circles out across your breast and into your armpit area. Squeeze your nipple. Check for fluid and lumps. Do these steps again to check your other breast. Sit or stand in the tub or shower. With soapy water on your skin, feel each breast the same way you did when you were lying down. Write down what you find Writing down what you find can help you keep track of what you want to tell your doctor. Write down: What's normal for each breast. Any changes you find. Write down: The kind of change. If your breast feels tender or painful. Any lump you find. Write down its size and where it is. When you last had your  period. General tips If you're breastfeeding, the best time to check your breasts is after you feed your baby or after you use a breast pump. If you get a period, the best time to check your breasts is 5-7 days after your period ends. With time, you'll get more used to doing the self-exam. You'll also start to know if there are changes in your breasts. Contact a doctor if: You see a change in the shape or size of your breasts or nipples. You see a change in the skin of your breast or nipples. You have fluid coming from your nipples that isn't normal. You find a new lump or thick area. You have breast pain. You have any  concerns about your breast health. This information is not intended to replace advice given to you by your health care provider. Make sure you discuss any questions you have with your health care provider. Document Revised: 02/21/2024 Document Reviewed: 02/21/2024 [employer] Patient Education  2025 [employer].

## 2025-01-07 LAB — COMPREHENSIVE METABOLIC PANEL WITH GFR
ALT: 15 IU/L (ref 0–32)
AST: 16 IU/L (ref 0–40)
Albumin: 4.5 g/dL (ref 4.0–5.0)
Alkaline Phosphatase: 79 IU/L (ref 41–116)
BUN/Creatinine Ratio: 16 (ref 9–23)
BUN: 12 mg/dL (ref 6–20)
Bilirubin Total: 0.3 mg/dL (ref 0.0–1.2)
CO2: 22 mmol/L (ref 20–29)
Calcium: 9.9 mg/dL (ref 8.7–10.2)
Chloride: 103 mmol/L (ref 96–106)
Creatinine, Ser: 0.74 mg/dL (ref 0.57–1.00)
Globulin, Total: 2.8 g/dL (ref 1.5–4.5)
Glucose: 79 mg/dL (ref 70–99)
Potassium: 4.5 mmol/L (ref 3.5–5.2)
Sodium: 142 mmol/L (ref 134–144)
Total Protein: 7.3 g/dL (ref 6.0–8.5)
eGFR: 113 mL/min/1.73

## 2025-01-07 LAB — CBC WITH DIFFERENTIAL/PLATELET
Basophils Absolute: 0 x10E3/uL (ref 0.0–0.2)
Basos: 1 %
EOS (ABSOLUTE): 0 x10E3/uL (ref 0.0–0.4)
Eos: 1 %
Hematocrit: 44.7 % (ref 34.0–46.6)
Hemoglobin: 14.6 g/dL (ref 11.1–15.9)
Immature Grans (Abs): 0 x10E3/uL (ref 0.0–0.1)
Immature Granulocytes: 0 %
Lymphocytes Absolute: 2.9 x10E3/uL (ref 0.7–3.1)
Lymphs: 38 %
MCH: 29.9 pg (ref 26.6–33.0)
MCHC: 32.7 g/dL (ref 31.5–35.7)
MCV: 92 fL (ref 79–97)
Monocytes Absolute: 0.6 x10E3/uL (ref 0.1–0.9)
Monocytes: 7 %
Neutrophils Absolute: 4.1 x10E3/uL (ref 1.4–7.0)
Neutrophils: 53 %
Platelets: 378 x10E3/uL (ref 150–450)
RBC: 4.88 x10E6/uL (ref 3.77–5.28)
RDW: 12.4 % (ref 11.7–15.4)
WBC: 7.6 x10E3/uL (ref 3.4–10.8)

## 2025-01-07 LAB — HEMOGLOBIN A1C
Est. average glucose Bld gHb Est-mCnc: 108 mg/dL
Hgb A1c MFr Bld: 5.4 % (ref 4.8–5.6)

## 2025-01-07 LAB — HEPATITIS C ANTIBODY: Hep C Virus Ab: NONREACTIVE

## 2025-01-07 LAB — LIPID PANEL
Chol/HDL Ratio: 4.7 ratio — ABNORMAL HIGH (ref 0.0–4.4)
Cholesterol, Total: 177 mg/dL (ref 100–199)
HDL: 38 mg/dL — ABNORMAL LOW
LDL Chol Calc (NIH): 117 mg/dL — ABNORMAL HIGH (ref 0–99)
Triglycerides: 120 mg/dL (ref 0–149)
VLDL Cholesterol Cal: 22 mg/dL (ref 5–40)

## 2025-01-08 LAB — HSV NAA
HSV 1 NAA: NEGATIVE
HSV 2 NAA: NEGATIVE

## 2025-01-09 ENCOUNTER — Ambulatory Visit: Payer: Self-pay | Admitting: Licensed Practical Nurse

## 2025-01-10 LAB — CYTOLOGY - PAP: Diagnosis: NEGATIVE

## 2025-01-17 ENCOUNTER — Other Ambulatory Visit: Payer: Self-pay | Admitting: Licensed Practical Nurse

## 2025-01-17 DIAGNOSIS — Z3041 Encounter for surveillance of contraceptive pills: Secondary | ICD-10-CM

## 2025-01-17 DIAGNOSIS — Z01419 Encounter for gynecological examination (general) (routine) without abnormal findings: Secondary | ICD-10-CM
